# Patient Record
Sex: Female | Born: 1960 | Race: White | Hispanic: No | Marital: Married | Smoking: Never smoker
Health system: Southern US, Community
[De-identification: ages and names within clinical notes are randomized; demographics above are authoritative.]

## PROBLEM LIST (undated history)

## (undated) DIAGNOSIS — T7840XA Allergy, unspecified, initial encounter: Secondary | ICD-10-CM

## (undated) DIAGNOSIS — S42309A Unspecified fracture of shaft of humerus, unspecified arm, initial encounter for closed fracture: Secondary | ICD-10-CM

## (undated) DIAGNOSIS — G4730 Sleep apnea, unspecified: Secondary | ICD-10-CM

## (undated) DIAGNOSIS — I10 Essential (primary) hypertension: Secondary | ICD-10-CM

## (undated) DIAGNOSIS — R748 Abnormal levels of other serum enzymes: Secondary | ICD-10-CM

## (undated) DIAGNOSIS — L0292 Furuncle, unspecified: Secondary | ICD-10-CM

## (undated) DIAGNOSIS — K219 Gastro-esophageal reflux disease without esophagitis: Secondary | ICD-10-CM

## (undated) HISTORY — PX: PAROTIDECTOMY: SUR1003

## (undated) HISTORY — DX: Allergy, unspecified, initial encounter: T78.40XA

## (undated) HISTORY — PX: TOTAL ABDOMINAL HYSTERECTOMY: SHX209

## (undated) HISTORY — DX: Unspecified fracture of shaft of humerus, unspecified arm, initial encounter for closed fracture: S42.309A

## (undated) HISTORY — PX: ABDOMINAL HYSTERECTOMY: SHX81

## (undated) HISTORY — PX: APPENDECTOMY: SHX54

## (undated) HISTORY — DX: Gastro-esophageal reflux disease without esophagitis: K21.9

## (undated) HISTORY — DX: Essential (primary) hypertension: I10

## (undated) HISTORY — DX: Furuncle, unspecified: L02.92

## (undated) HISTORY — PX: OVARIAN CYST REMOVAL: SHX89

## (undated) HISTORY — DX: Abnormal levels of other serum enzymes: R74.8

## (undated) HISTORY — DX: Sleep apnea, unspecified: G47.30

---

## 2011-04-17 ENCOUNTER — Ambulatory Visit: Payer: Self-pay

## 2012-01-26 ENCOUNTER — Ambulatory Visit: Payer: Self-pay | Admitting: Otolaryngology

## 2012-03-15 ENCOUNTER — Ambulatory Visit: Payer: Self-pay | Admitting: Otolaryngology

## 2012-07-19 ENCOUNTER — Ambulatory Visit: Payer: Self-pay | Admitting: Otolaryngology

## 2012-08-18 ENCOUNTER — Ambulatory Visit: Payer: Self-pay | Admitting: Otolaryngology

## 2012-08-19 LAB — PATHOLOGY REPORT

## 2013-04-25 ENCOUNTER — Ambulatory Visit: Payer: Self-pay | Admitting: Gastroenterology

## 2014-06-23 NOTE — Op Note (Signed)
PATIENT NAME:  Carrie Arroyo, Carrie Arroyo MR#:  [id number] DATE OF BIRTH:  [birth year]  DATE OF PROCEDURE:  08/18/2012  PREOPERATIVE DIAGNOSIS: Bilateral parotid masses.   POSTOPERATIVE DIAGNOSIS: Bilateral parotid masses.   PROCEDURE:  1. Right superficial parotidectomy with facial nerve dissection.  2. Ultrasound-guided fine needle aspiration of left parotid mass.   SURGEON: Ollen Grossaul S. Willeen CassBennett, MD  FIRST ASSISTANT: Davina Pokehapman T. McQueen, MD   ANESTHESIA: General endotracheal.   INDICATIONS: The patient with a history of right parotid mass, with a smaller mass notable in the left parotid on CT imaging. 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   FINDINGS: There was an approximately 2 cm right tail of parotid mass removed. Needle aspiration was performed of the smaller left parotid mass, which was difficult to palpate but visualized easily on ultrasound.   COMPLICATIONS: None.   DESCRIPTION OF PROCEDURE: After obtaining informed consent, the patient was taken to the operating room and placed in the supine position. After induction of general endotracheal anesthesia, the patient was turned 90 degrees. The facial nerve monitors were placed in the usual fashion on the right face and the skin injected with 1% lidocaine with epinephrine 1:200,000. She was then prepped and draped in the usual sterile fashion. A 15-blade was used to incise the skin in an S-shaped incision going from in front of the ear down below the earlobe and curving down into the neck to utilize an upper neck skin crease. The incision was carried down through the platysma, and a subplatysmal skin flap elevated over the parotid fascia, taking care not to enter the palpable mass in the tail of the parotid. Dissection proceeded inferiorly down to the sternocleidomastoid muscle and proceeded superiorly up to expose the tragal cartilage. Dissection then proceeded down to the digastric muscle inferiorly, dividing the fascial attachments  between the parotid and the sternocleidomastoid and mastoid bone area. Care was taken to monitor the face, and proper functioning of the facial nerve monitor was confirmed for this purpose. Dissection proceeded down towards the stylomastoid foramen, carefully watching for any movement, and the facial nerve was identified and confirmed with the stimulator. The trunk of the facial nerve was then traced out to identify the inferior, superior and middle branches, which were then carefully dissected out, using either the bipolar or the Harmonic scalpel to divide parotid tissue as the dissection progressed. Smaller branches were confirmed with the nerve stimulator on a low setting. The mass and associated parotid tissue were dissected away from the nerve in a sequential fashion and away from the sternocleidomastoid muscle and the retrofacial vein until the mass and associated parotid tissue were free of the facial nerve completely. The mass and associated parotid tissue were then resected anterior to the mass, with the facial nerve bundles all down and in clear view. The mass was sent with the anterior border marked with a stitch. The wound was irrigated, and the facial nerve main trunk stimulated with all branches moving. A #10 TLS drain was placed through the skin and secured with a 5-0 Prolene suture. The residual parotid tissue was tacked back to the sternocleidomastoid muscle with 4-0 Vicryl to help fill the defect from the tumor excision. The skin was closed with 4-0 Vicryl suture for the subcutaneous closure, followed by 5-0 Prolene suture in a running locked stitch for the skin. Bacitracin ointment was then applied.   Her head was then turned in the opposite direction and the parotid palpated on the left side.  I could vaguely feel a small mass in the tail of the parotid, and this was confirmed, after prepping the skin sterilely, by using the ultrasound. A 22-gauge needle was used to make 2 different passes  through the mass under ultrasound guidance, and the material sent in saline to pathology. The patient was then returned to the anesthesiologist for awakening. She was awakened and taken to the recovery room in good condition postoperatively. Blood loss was approximately 50 mL.  ____________________________ Ollen Gross. Willeen Cass, MD psb:OSi D: 08/18/2012 10:00:10 ET T: 08/18/2012 12:07:59 ET JOB#: [id number]  cc: Ollen Gross. Willeen Cass, MD, <Dictator> Sandi Mealy MD ELECTRONICALLY SIGNED 08/20/2012 21:51

## 2014-08-14 ENCOUNTER — Telehealth: Payer: Self-pay

## 2014-08-14 MED ORDER — NITROFURANTOIN MONOHYD MACRO 100 MG PO CAPS: 100.0000 mg | ORAL_CAPSULE | Freq: Two times a day (BID) | ORAL | Status: DC

## 2014-08-14 NOTE — Telephone Encounter (Signed)
-----   Message from Gabriel Cirri, NP sent at 08/14/2014  4:31 PM EDT ----- Regarding: FW: Possible UTI   ----- Message -----    From: Bayard Hugger, CMA    Sent: 08/14/2014   4:02 PM      To: Gabriel Cirri, NP Subject: Possible UTI                                   Pt called and stated she went to the bathroom and had burning, pain, pressure, etc. Thinks she has a UTI and wants to know if you can call her in a medication for this because she is going out of town tomorrow. She stated she could make an appt to come in next week if need be.

## 2014-08-14 NOTE — Telephone Encounter (Signed)
Pt called stating when she used the restroom, she had burning, pain, pressure, etc, so she believes she may have a UTI. She wants to know if you can write her a rx for this because she is going out of town tomorrow. She also stated she can come in next week for an appt if need be.

## 2014-08-21 DIAGNOSIS — R7401 Elevation of levels of liver transaminase levels: Secondary | ICD-10-CM

## 2014-08-21 DIAGNOSIS — E8881 Metabolic syndrome: Secondary | ICD-10-CM

## 2014-08-21 DIAGNOSIS — R7301 Impaired fasting glucose: Secondary | ICD-10-CM

## 2014-08-21 DIAGNOSIS — R7303 Prediabetes: Secondary | ICD-10-CM | POA: Insufficient documentation

## 2014-08-21 DIAGNOSIS — E785 Hyperlipidemia, unspecified: Secondary | ICD-10-CM | POA: Insufficient documentation

## 2014-08-21 DIAGNOSIS — E559 Vitamin D deficiency, unspecified: Secondary | ICD-10-CM

## 2014-08-21 DIAGNOSIS — J309 Allergic rhinitis, unspecified: Secondary | ICD-10-CM | POA: Insufficient documentation

## 2014-08-21 DIAGNOSIS — R7402 Elevation of levels of lactic acid dehydrogenase (LDH): Secondary | ICD-10-CM

## 2014-08-21 DIAGNOSIS — E669 Obesity, unspecified: Secondary | ICD-10-CM

## 2014-08-21 DIAGNOSIS — R740 Nonspecific elevation of levels of transaminase and lactic acid dehydrogenase [LDH]: Secondary | ICD-10-CM

## 2014-08-21 DIAGNOSIS — I10 Essential (primary) hypertension: Secondary | ICD-10-CM | POA: Insufficient documentation

## 2014-08-22 ENCOUNTER — Encounter: Payer: Self-pay | Admitting: Unknown Physician Specialty

## 2014-08-22 ENCOUNTER — Ambulatory Visit (INDEPENDENT_AMBULATORY_CARE_PROVIDER_SITE_OTHER): Payer: BLUE CROSS/BLUE SHIELD | Admitting: Unknown Physician Specialty

## 2014-08-22 VITALS — BP 140/87 | HR 91 | Temp 98.4°F | Ht 64.6 in | Wt 217.2 lb

## 2014-08-22 DIAGNOSIS — R300 Dysuria: Secondary | ICD-10-CM | POA: Diagnosis not present

## 2014-08-22 MED ORDER — CIPROFLOXACIN HCL 250 MG PO TABS: 250.0000 mg | ORAL_TABLET | Freq: Two times a day (BID) | ORAL | Status: DC

## 2014-08-22 NOTE — Patient Instructions (Signed)

## 2014-08-22 NOTE — Progress Notes (Addendum)
   BP 140/87 mmHg  Pulse 91  Temp(Src) 98.4 F (36.9 C)  Ht 5' 4.6" (1.641 m)  Wt 217 lb 3.2 oz (98.521 kg)  BMI 36.59 kg/m2  SpO2 99%  LMP  (LMP Unknown)   Subjective:    Patient ID: Carrie Arroyo, female    DOB: 12/30/1960, 54 y.o.   MRN: [id number]  HPI: Carrie Arroyo is a 54 y.o. female  Chief Complaint  Patient presents with  . Urinary Tract Infection    Relevant past medical, surgical, family and social history reviewed and updated as indicated. Interim medical history since our last visit reviewed. Allergies and medications reviewed and updated.  Urinary Tract Infection  This is a new (Recieved Macrobid which helped but it is still there) problem. The current episode started in the past 7 days. The problem occurs every urination. The quality of the pain is described as burning. There has been no fever. Associated symptoms include urgency. Associated symptoms comments: pressure. She has tried antibiotics for the symptoms. The treatment provided moderate relief.     Review of Systems  Genitourinary: Positive for urgency.    Per HPI unless specifically indicated above     Objective:    BP 140/87 mmHg  Pulse 91  Temp(Src) 98.4 F (36.9 C)  Ht 5' 4.6" (1.641 m)  Wt 217 lb 3.2 oz (98.521 kg)  BMI 36.59 kg/m2  SpO2 99%  LMP  (LMP Unknown)  Wt Readings from Last 3 Encounters:  08/22/14 217 lb 3.2 oz (98.521 kg)  07/21/14 215 lb (97.523 kg)    Physical Exam  Constitutional: She is oriented to person, place, and time. She appears well-developed and well-nourished. No distress.  HENT:  Head: Normocephalic and atraumatic.  Eyes: Conjunctivae and lids are normal. Right eye exhibits no discharge. Left eye exhibits no discharge. No scleral icterus.  Cardiovascular: Normal rate, regular rhythm and normal heart sounds.   Pulmonary/Chest: Effort normal. No respiratory distress.  Abdominal: Soft. Normal appearance and bowel sounds are normal. She exhibits no distension.  There is no splenomegaly or hepatomegaly. There is no tenderness.  Musculoskeletal: Normal range of motion.  Neurological: She is alert and oriented to person, place, and time.  Skin: Skin is intact. No rash noted. No pallor.  Psychiatric: She has a normal mood and affect. Her behavior is normal. Judgment and thought content normal.      Assessment & Plan:   Problem List Items Addressed This Visit    None    Visit Diagnoses    Dysuria    -  Primary    Relevant Orders    UA/M w/rflx Culture, Routine       Urine negative except for small amount of blood.  Will rx Cipro as symptoms partially relieved by Macrobid.    Follow up plan: Return if symptoms worsen or fail to improve.

## 2014-08-22 NOTE — Addendum Note (Signed)
Addended by: Gabriel Cirri on: 08/22/2014 04:39 PM   Modules accepted: Kipp Brood

## 2014-08-23 LAB — UA/M W/RFLX CULTURE, ROUTINE
Bilirubin, UA: NEGATIVE
Glucose, UA: NEGATIVE
Ketones, UA: NEGATIVE
Leukocytes, UA: NEGATIVE
Nitrite, UA: NEGATIVE
Protein, UA: NEGATIVE
Specific Gravity, UA: 1.005 (ref 1.005–1.030)
Urobilinogen, Ur: 0.2 mg/dL (ref 0.2–1.0)
pH, UA: 5.5 (ref 5.0–7.5)

## 2014-08-23 LAB — MICROSCOPIC EXAMINATION: WBC, UA: NONE SEEN /hpf (ref 0–?)

## 2015-01-08 ENCOUNTER — Other Ambulatory Visit: Payer: Self-pay

## 2015-01-08 MED ORDER — BENAZEPRIL HCL 20 MG PO TABS: 20.0000 mg | ORAL_TABLET | Freq: Every day | ORAL | Status: DC

## 2015-01-08 NOTE — Telephone Encounter (Signed)
Patient has appointment 01/15/15 and pharmacy is [facility] on [address].

## 2015-01-15 ENCOUNTER — Encounter: Payer: Self-pay | Admitting: Unknown Physician Specialty

## 2015-01-15 ENCOUNTER — Ambulatory Visit (INDEPENDENT_AMBULATORY_CARE_PROVIDER_SITE_OTHER): Payer: BLUE CROSS/BLUE SHIELD | Admitting: Unknown Physician Specialty

## 2015-01-15 VITALS — BP 129/78 | HR 89 | Temp 98.9°F | Ht 65.2 in | Wt 209.6 lb

## 2015-01-15 DIAGNOSIS — E785 Hyperlipidemia, unspecified: Secondary | ICD-10-CM | POA: Diagnosis not present

## 2015-01-15 DIAGNOSIS — J0100 Acute maxillary sinusitis, unspecified: Secondary | ICD-10-CM | POA: Diagnosis not present

## 2015-01-15 DIAGNOSIS — I10 Essential (primary) hypertension: Secondary | ICD-10-CM

## 2015-01-15 DIAGNOSIS — R7301 Impaired fasting glucose: Secondary | ICD-10-CM

## 2015-01-15 DIAGNOSIS — E8881 Metabolic syndrome: Secondary | ICD-10-CM | POA: Diagnosis not present

## 2015-01-15 LAB — LIPID PANEL PICCOLO, WAIVED
Chol/HDL Ratio Piccolo,Waive: 3.9 mg/dL
Cholesterol Piccolo, Waived: 179 mg/dL (ref <200)
HDL Chol Piccolo, Waived: 46 mg/dL — ABNORMAL LOW (ref >59)
LDL Chol Calc Piccolo Waived: 103 mg/dL — ABNORMAL HIGH (ref <100)
Triglycerides Piccolo,Waived: 147 mg/dL (ref <150)
VLDL Chol Calc Piccolo,Waive: 29 mg/dL (ref <30)

## 2015-01-15 LAB — BAYER DCA HB A1C WAIVED: HB A1C (BAYER DCA - WAIVED): 6.1 % (ref <7.0)

## 2015-01-15 LAB — MICROALBUMIN, URINE WAIVED
Creatinine, Urine Waived: 10 mg/dL (ref 10–300)
Microalb, Ur Waived: 10 mg/L (ref 0–19)
Microalb/Creat Ratio: <30 mg/g (ref <30)

## 2015-01-15 MED ORDER — AZITHROMYCIN 250 MG PO TABS: ORAL_TABLET | ORAL | Status: DC

## 2015-01-15 MED ORDER — SIMVASTATIN 20 MG PO TABS: 20.0000 mg | ORAL_TABLET | Freq: Every evening | ORAL | Status: DC | PRN

## 2015-01-15 NOTE — Assessment & Plan Note (Signed)
Reviewed lipid panel.  LDL was 103.  Continue present medications.

## 2015-01-15 NOTE — Progress Notes (Signed)
+----  BP 129/78 mmHg  Pulse 89  Temp(Src) 98.9 F (37.2 C)  Ht 5' 5.2" (1.656 m)  Wt 209 lb 9.6 oz (95.074 kg)  BMI 34.67 kg/m2  SpO2 100%  LMP  (LMP Unknown)   Subjective:    Patient ID: Carrie Arroyo, female    DOB: 1960-08-18, 54 y.o.   MRN: [id number]  HPI: Carrie Arroyo is a 54 y.o. female  Chief Complaint  Patient presents with  . Hyperlipidemia  . Hypertension   Hypertension Using medications without difficulty Average home BPs 120's/low 70's   No problems or lightheadedness No chest pain with exertion or shortness of breath No Edema   Hyperlipidemia Using medications without problems: No Muscle aches:  Diet compliance: Exercise:  IFG Hgb A1C 6.2 last visit  Hypertension  URI  This is a new problem. The current episode started 1 to 4 weeks ago. The problem has been unchanged (Not quite gone yet). There has been no fever. Associated symptoms include congestion, coughing and ear pain. She has tried decongestant for the symptoms. The treatment provided no relief.    Relevant past medical, surgical, family and social history reviewed and updated as indicated. Interim medical history since our last visit reviewed. Allergies and medications reviewed and updated.  Review of Systems  HENT: Positive for congestion and ear pain.   Respiratory: Positive for cough.     Per HPI unless specifically indicated above     Objective:    BP 129/78 mmHg  Pulse 89  Temp(Src) 98.9 F (37.2 C)  Ht 5' 5.2" (1.656 m)  Wt 209 lb 9.6 oz (95.074 kg)  BMI 34.67 kg/m2  SpO2 100%  LMP  (LMP Unknown)  Wt Readings from Last 3 Encounters:  01/15/15 209 lb 9.6 oz (95.074 kg)  08/22/14 217 lb 3.2 oz (98.521 kg)  07/21/14 215 lb (97.523 kg)    Physical Exam  Constitutional: She is oriented to person, place, and time. She appears well-developed and well-nourished. No distress.  HENT:  Head: Normocephalic and atraumatic.  Right Ear: Tympanic membrane and ear canal  normal.  Left Ear: Tympanic membrane and ear canal normal.  Nose: No rhinorrhea. Right sinus exhibits maxillary sinus tenderness. Right sinus exhibits no frontal sinus tenderness. Left sinus exhibits maxillary sinus tenderness. Left sinus exhibits no frontal sinus tenderness.  Eyes: Conjunctivae and lids are normal. Right eye exhibits no discharge. Left eye exhibits no discharge. No scleral icterus.  Cardiovascular: Normal rate, regular rhythm and normal heart sounds.   Pulmonary/Chest: Effort normal. No respiratory distress. She has wheezes.  Abdominal: Normal appearance. There is no splenomegaly or hepatomegaly.  Musculoskeletal: Normal range of motion.  Neurological: She is alert and oriented to person, place, and time.  Skin: Skin is intact. No rash noted. No pallor.  Psychiatric: She has a normal mood and affect. Her behavior is normal. Judgment and thought content normal.       Assessment & Plan:   Problem List Items Addressed This Visit      Unprioritized   Metabolic syndrome - Primary    Hgb A1C is 6.1      Relevant Orders   Lipid Panel Piccolo, Waived   Bayer DCA Hb A1c Waived   Microalbumin, Urine Waived   Uric acid   Comprehensive metabolic panel   Hypertension    Stable, continue present medications.       Relevant Medications   simvastatin (ZOCOR) 20 MG tablet   Other Relevant Orders   Lipid  Panel Piccolo, Waived   Bayer DCA Hb A1c Waived   Microalbumin, Urine Waived   Uric acid   Comprehensive metabolic panel   Hyperlipidemia    Reviewed lipid panel.  LDL was 103.  Continue present medications.         Relevant Medications   simvastatin (ZOCOR) 20 MG tablet   Other Relevant Orders   Lipid Panel Piccolo, Waived   Bayer DCA Hb A1c Waived   Microalbumin, Urine Waived   Uric acid   Comprehensive metabolic panel    Other Visit Diagnoses    IFG (impaired fasting glucose)        Relevant Orders    Lipid Panel Piccolo, Waived    Bayer DCA Hb A1c Waived     Microalbumin, Urine Waived    Uric acid    Comprehensive metabolic panel    Acute maxillary sinusitis, recurrence not specified        Relevant Medications    azithromycin (ZITHROMAX) 250 MG tablet        Follow up plan: Return in about 6 months (around 07/15/2015) for physical.

## 2015-01-15 NOTE — Assessment & Plan Note (Signed)
Hgb A1C is 6.1% 

## 2015-01-15 NOTE — Assessment & Plan Note (Signed)
Stable, continue present medications.   

## 2015-01-16 LAB — COMPREHENSIVE METABOLIC PANEL
ALT: 53 IU/L — ABNORMAL HIGH (ref 0–32)
AST: 49 IU/L — ABNORMAL HIGH (ref 0–40)
Albumin/Globulin Ratio: 2.2 (ref 1.1–2.5)
Albumin: 4.6 g/dL (ref 3.5–5.5)
Alkaline Phosphatase: 100 IU/L (ref 39–117)
BUN/Creatinine Ratio: 11 (ref 9–23)
BUN: 7 mg/dL (ref 6–24)
Bilirubin Total: 0.3 mg/dL (ref 0.0–1.2)
CO2: 25 mmol/L (ref 18–29)
Calcium: 10.2 mg/dL (ref 8.7–10.2)
Chloride: 100 mmol/L (ref 97–106)
Creatinine, Ser: 0.66 mg/dL (ref 0.57–1.00)
GFR calc Af Amer: 117 mL/min/{1.73_m2} (ref >59)
GFR calc non Af Amer: 101 mL/min/{1.73_m2} (ref >59)
Globulin, Total: 2.1 g/dL (ref 1.5–4.5)
Glucose: 100 mg/dL — ABNORMAL HIGH (ref 65–99)
Potassium: 4.9 mmol/L (ref 3.5–5.2)
Sodium: 141 mmol/L (ref 136–144)
Total Protein: 6.7 g/dL (ref 6.0–8.5)

## 2015-01-16 LAB — URIC ACID: Uric Acid: 5.1 mg/dL (ref 2.5–7.1)

## 2015-01-17 ENCOUNTER — Encounter: Payer: Self-pay | Admitting: Unknown Physician Specialty

## 2015-01-27 ENCOUNTER — Other Ambulatory Visit: Payer: Self-pay | Admitting: Unknown Physician Specialty

## 2015-01-29 NOTE — Telephone Encounter (Signed)
Pt needs check further refills 

## 2015-02-20 ENCOUNTER — Other Ambulatory Visit: Payer: Self-pay | Admitting: Unknown Physician Specialty

## 2015-03-25 ENCOUNTER — Other Ambulatory Visit: Payer: Self-pay | Admitting: Unknown Physician Specialty

## 2015-04-28 ENCOUNTER — Encounter: Payer: Self-pay | Admitting: Emergency Medicine

## 2015-04-28 ENCOUNTER — Emergency Department
Admission: EM | Admit: 2015-04-28 | Discharge: 2015-04-28 | Disposition: A | Discharge status: Home / Self Care | Payer: BLUE CROSS/BLUE SHIELD | Attending: Emergency Medicine | Admitting: Emergency Medicine

## 2015-04-28 ENCOUNTER — Emergency Department: Payer: BLUE CROSS/BLUE SHIELD

## 2015-04-28 DIAGNOSIS — I10 Essential (primary) hypertension: Secondary | ICD-10-CM | POA: Insufficient documentation

## 2015-04-28 DIAGNOSIS — R599 Enlarged lymph nodes, unspecified: Secondary | ICD-10-CM

## 2015-04-28 DIAGNOSIS — Z7951 Long term (current) use of inhaled steroids: Secondary | ICD-10-CM | POA: Diagnosis not present

## 2015-04-28 DIAGNOSIS — Z79899 Other long term (current) drug therapy: Secondary | ICD-10-CM | POA: Insufficient documentation

## 2015-04-28 DIAGNOSIS — Z7982 Long term (current) use of aspirin: Secondary | ICD-10-CM | POA: Insufficient documentation

## 2015-04-28 DIAGNOSIS — Z880 Allergy status to penicillin: Secondary | ICD-10-CM | POA: Insufficient documentation

## 2015-04-28 DIAGNOSIS — R591 Generalized enlarged lymph nodes: Secondary | ICD-10-CM

## 2015-04-28 DIAGNOSIS — H9202 Otalgia, left ear: Secondary | ICD-10-CM | POA: Diagnosis not present

## 2015-04-28 DIAGNOSIS — R590 Localized enlarged lymph nodes: Secondary | ICD-10-CM | POA: Insufficient documentation

## 2015-04-28 LAB — CBC WITH DIFFERENTIAL/PLATELET
Basophils Absolute: 0.1 10*3/uL (ref 0–0.1)
Basophils Relative: 0 %
Eosinophils Absolute: 0.2 10*3/uL (ref 0–0.7)
Eosinophils Relative: 1 %
HCT: 42.2 % (ref 35.0–47.0)
Hemoglobin: 14.4 g/dL (ref 12.0–16.0)
Lymphocytes Relative: 28 %
Lymphs Abs: 6.8 10*3/uL — ABNORMAL HIGH (ref 1.0–3.6)
MCH: 31.5 pg (ref 26.0–34.0)
MCHC: 34 g/dL (ref 32.0–36.0)
MCV: 92.7 fL (ref 80.0–100.0)
Monocytes Absolute: 3 10*3/uL — ABNORMAL HIGH (ref 0.2–0.9)
Monocytes Relative: 12 %
Neutro Abs: 14.1 10*3/uL — ABNORMAL HIGH (ref 1.4–6.5)
Neutrophils Relative %: 59 %
Platelets: 328 10*3/uL (ref 150–440)
RBC: 4.55 MIL/uL (ref 3.80–5.20)
RDW: 12.6 % (ref 11.5–14.5)
WBC: 24.2 10*3/uL — ABNORMAL HIGH (ref 3.6–11.0)

## 2015-04-28 LAB — BASIC METABOLIC PANEL
Anion gap: 10 (ref 5–15)
BUN: 13 mg/dL (ref 6–20)
CO2: 24 mmol/L (ref 22–32)
Calcium: 8.7 mg/dL — ABNORMAL LOW (ref 8.9–10.3)
Chloride: 100 mmol/L — ABNORMAL LOW (ref 101–111)
Creatinine, Ser: 0.65 mg/dL (ref 0.44–1.00)
GFR calc Af Amer: >60 mL/min (ref >60)
GFR calc non Af Amer: >60 mL/min (ref >60)
Glucose, Bld: 94 mg/dL (ref 65–99)
Potassium: 3.3 mmol/L — ABNORMAL LOW (ref 3.5–5.1)
Sodium: 134 mmol/L — ABNORMAL LOW (ref 135–145)

## 2015-04-28 MED ORDER — DEXAMETHASONE SODIUM PHOSPHATE 10 MG/ML IJ SOLN
10.0000 mg | Freq: Once | INTRAMUSCULAR | Status: AC
  Administered 2015-04-28: 10 mg via INTRAMUSCULAR
  Filled 2015-04-28: qty 1

## 2015-04-28 MED ORDER — IOHEXOL 300 MG/ML  SOLN
75.0000 mL | Freq: Once | INTRAMUSCULAR | Status: AC | PRN
  Administered 2015-04-28: 75 mL via INTRAVENOUS
  Filled 2015-04-28: qty 75

## 2015-04-28 NOTE — ED Notes (Signed)
Reviewed d/c instructions, follow-up care, and medications with pt.  Pt verbalized understanding. 

## 2015-04-28 NOTE — ED Notes (Signed)
Reddened outer L ear x 4 days. Lymph node enlargement.

## 2015-04-28 NOTE — Discharge Instructions (Signed)
Advised discontinuing antibiotics. Continue prednisone as directed. Follow-up with the ENT clinic by calling for an appointment on Monday morning.

## 2015-04-28 NOTE — ED Provider Notes (Signed)
[facility] Emergency Department Provider Note ____________________________________________  Time seen: Approximately 5:56 PM  I have reviewed the triage vital signs and the nursing notes.   HISTORY  Chief Complaint Ear Pain    HPI Carrie Arroyo is a 55 y.o. female patient complain of lymph node enlargement inferior anterior left ear for 4 days. Patient was seen by PCP and given clindamycin and prednisone. Patient is much improved with her first 2 days and that her condition worsen in the last 2 days. Patient states her pain is a 9/10. No palliative measures taken for pain. Patient denies any hearing loss or vertigo. Patient denies any fever or upper rest or infection signs and symptoms.   Past Medical History  Diagnosis Date  . Allergy   . Hypertension   . Boils   . Sleep apnea   . Elevated liver enzymes     Patient Active Problem List   Diagnosis Date Noted  . Metabolic syndrome 08/21/2014  . Hypertension 08/21/2014  . Vitamin D deficiency 08/21/2014  . Allergic rhinitis 08/21/2014  . Nonspecific elevation of levels of transaminase or lactic acid dehydrogenase (LDH) 08/21/2014  . Impaired fasting glucose 08/21/2014  . Hyperlipidemia 08/21/2014  . Obesity 08/21/2014    Past Surgical History  Procedure Laterality Date  . Abdominal hysterectomy    . Ovarian cyst removal    . Appendectomy    . Parotidectomy      Current Outpatient Rx  Name  Route  Sig  Dispense  Refill  . aspirin 81 MG tablet   Oral   Take 81 mg by mouth daily.         Marland Kitchen azithromycin (ZITHROMAX) 250 MG tablet      As directed   6 tablet   0   . benazepril (LOTENSIN) 20 MG tablet      TAKE 1 TABLET (20 MG TOTAL) BY MOUTH DAILY.   30 tablet   5   . benazepril (LOTENSIN) 20 MG tablet      TAKE 1 TABLET (20 MG TOTAL) BY MOUTH DAILY.   30 tablet   1   . fluticasone (FLONASE) 50 MCG/ACT nasal spray   Each Nare   Place 2 sprays into both nostrils daily.          Marland Kitchen loratadine (CLARITIN) 10 MG tablet   Oral   Take 10 mg by mouth daily.         . minocycline (MINOCIN,DYNACIN) 100 MG capsule      TAKE ONE CAPSULE BY MOUTH TWICE A DAY FOR FLARES      4   . Multiple Vitamin (MULTIVITAMIN) tablet   Oral   Take 1 tablet by mouth daily.         Marland Kitchen omeprazole (PRILOSEC) 20 MG capsule   Oral   Take 20 mg by mouth daily.         . simvastatin (ZOCOR) 20 MG tablet   Oral   Take 1 tablet (20 mg total) by mouth at bedtime as needed.   30 tablet   0     Allergies Penicillin g benzathine and Sulfa antibiotics  Family History  Problem Relation Age of Onset  . Adopted: Yes  . Family history unknown: Yes    Social History Social History  Substance Use Topics  . Smoking status: Never Smoker   . Smokeless tobacco: Never Used  . Alcohol Use: 1.2 oz/week    2 Glasses of wine per week  Review of Systems Constitutional: No fever/chills Eyes: No visual changes. ENT: No sore throat edema and erythema inferior anterior left ear. Cardiovascular: Denies chest pain. Respiratory: Denies shortness of breath. Gastrointestinal: No abdominal pain.  No nausea, no vomiting.  No diarrhea.  No constipation. Genitourinary: Negative for dysuria. Musculoskeletal: Negative for back pain. Skin: Negative for rash. Neurological: Negative for headaches, focal weakness or numbness. Endocrine:Hypertension Hematological/Lymphatic: Allergic/Immunilogical: Penicillin and sulfa antibiotics  10-point ROS otherwise negative.  ____________________________________________   PHYSICAL EXAM:  VITAL SIGNS: ED Triage Vitals  Enc Vitals Group     BP 04/28/15 1743 176/90 mmHg     Pulse Rate 04/28/15 1743 83     Resp 04/28/15 1743 18     Temp 04/28/15 1743 98 F (36.7 C)     Temp Source 04/28/15 1743 Oral     SpO2 04/28/15 1743 100 %     Weight 04/28/15 1743 210 lb (95.255 kg)     Height 04/28/15 1743  (1.651 m)     Head Cir --      Peak Flow  --      Pain Score 04/28/15 1744 9     Pain Loc --      Pain Edu? --      Excl. in GC? --     Constitutional: Alert and oriented. Well appearing and in no acute distress. Eyes: Conjunctivae are normal. PERRL. EOMI. Head: Atraumatic. Nose: No congestion/rhinnorhea. Mouth/Throat: Mucous membranes are moist.  Oropharynx non-erythematous. Neck: No stridor.  No cervical spine tenderness to palpation. Hematological/Lymphatic/Immunilogical: Left auricular  lymphadenopathy. Cardiovascular: Normal rate, regular rhythm. Grossly normal heart sounds.  Good peripheral circulation. Elevated blood pressure Respiratory: Normal respiratory effort.  No retractions. Lungs CTAB. Gastrointestinal: Soft and nontender. No distention. No abdominal bruits. No CVA tenderness. Musculoskeletal: No lower extremity tenderness nor edema.  No joint effusions. Neurologic:  Normal speech and language. No gross focal neurologic deficits are appreciated. No gait instability. Skin:  Skin is warm, dry and intact. No rash noted. Psychiatric: Mood and affect are normal. Speech and behavior are normal.  ____________________________________________   LABS (all labs ordered are listed, but only abnormal results are displayed)  Labs Reviewed  BASIC METABOLIC PANEL - Abnormal; Notable for the following:    Sodium 134 (*)    Potassium 3.3 (*)    Chloride 100 (*)    Calcium 8.7 (*)    All other components within normal limits  CBC WITH DIFFERENTIAL/PLATELET - Abnormal; Notable for the following:    WBC 24.2 (*)    Neutro Abs 14.1 (*)    Lymphs Abs 6.8 (*)    Monocytes Absolute 3.0 (*)    All other components within normal limits   ____________________________________________  EKG   ____________________________________________  RADIOLOGY  CT showed a left parotid mass measuring 1.41.8 cm consistent for cystic mass . ____________________________________________   PROCEDURES  Procedure(s) performed:  None  Critical Care performed: No  ____________________________________________   INITIAL IMPRESSION / ASSESSMENT AND PLAN / ED COURSE  Pertinent labs & imaging results that were available during my care of the patient were reviewed by me and considered in my medical decision making (see chart for details).  Left parotid mass. Patient advised to discontinue antibiotics at this time. Fast patient to continue taking prednisone and follow-up with the ENT clinic by calling for an appointment on Monday morning. ____________________________________________   FINAL CLINICAL IMPRESSION(S) / ED DIAGNOSES  Final diagnoses:  Adenopathy      Joni Reining,  PA-C 04/28/15 2057  Jennye Moccasin, MD 04/28/15 2123

## 2015-04-28 NOTE — ED Notes (Signed)
Pt came to nurse's station upset.  Pt given update on plan of care, PA called to room to speak w/ pt regarding plan of care. Pt taken to CT.  While pt gone, pillow, blanket, remote, and ice water left in pt room per pt request

## 2015-06-04 DIAGNOSIS — D110 Benign neoplasm of parotid gland: Secondary | ICD-10-CM | POA: Diagnosis not present

## 2015-06-04 DIAGNOSIS — J301 Allergic rhinitis due to pollen: Secondary | ICD-10-CM | POA: Diagnosis not present

## 2015-07-23 ENCOUNTER — Encounter: Payer: Self-pay | Admitting: Unknown Physician Specialty

## 2015-07-23 ENCOUNTER — Ambulatory Visit (INDEPENDENT_AMBULATORY_CARE_PROVIDER_SITE_OTHER): Payer: BLUE CROSS/BLUE SHIELD | Admitting: Unknown Physician Specialty

## 2015-07-23 VITALS — BP 137/78 | HR 95 | Temp 98.1°F | Ht 64.8 in | Wt 212.6 lb

## 2015-07-23 DIAGNOSIS — R7301 Impaired fasting glucose: Secondary | ICD-10-CM | POA: Diagnosis not present

## 2015-07-23 DIAGNOSIS — E559 Vitamin D deficiency, unspecified: Secondary | ICD-10-CM | POA: Diagnosis not present

## 2015-07-23 DIAGNOSIS — E785 Hyperlipidemia, unspecified: Secondary | ICD-10-CM | POA: Diagnosis not present

## 2015-07-23 DIAGNOSIS — I10 Essential (primary) hypertension: Secondary | ICD-10-CM | POA: Diagnosis not present

## 2015-07-23 DIAGNOSIS — D72829 Elevated white blood cell count, unspecified: Secondary | ICD-10-CM

## 2015-07-23 LAB — BAYER DCA HB A1C WAIVED: HB A1C (BAYER DCA - WAIVED): 5.9 % (ref <7.0)

## 2015-07-23 LAB — MICROALBUMIN, URINE WAIVED
Creatinine, Urine Waived: 50 mg/dL (ref 10–300)
Microalb, Ur Waived: 10 mg/L (ref 0–19)
Microalb/Creat Ratio: <30 mg/g (ref <30)

## 2015-07-23 NOTE — Assessment & Plan Note (Signed)
Stable, continue present medications.   

## 2015-07-23 NOTE — Assessment & Plan Note (Signed)
History of Will check levels. 

## 2015-07-23 NOTE — Progress Notes (Signed)
BP 137/78 mmHg  Pulse 95  Temp(Src) 98.1 F (36.7 C)  Ht 5' 4.8" (1.646 m)  Wt 212 lb 9.6 oz (96.435 kg)  BMI 35.59 kg/m2  SpO2 99%  LMP  (LMP Unknown)   Subjective:    Patient ID: Carrie Arroyo, female    DOB: 07/15/1960, 55 y.o.   MRN: [id number]  HPI: Carrie Arroyo is a 55 y.o. female  Chief Complaint  Patient presents with  . Annual Exam   Hypertension Using medications without difficulty Average home BPs 120's/78  No problems or lightheadedness No chest pain with exertion or shortness of breath No Edema   Hyperlipidemia Using medications without problems: No Muscle aches  Diet compliance: "OK" Exercise: "could be better"  IFG/Metabolic syndrome Last Hgb A1C was 6.1  Social History   Social History  . Marital Status: Married    Spouse Name: N/A  . Number of Children: N/A  . Years of Education: N/A   Occupational History  . Not on file.   Social History Main Topics  . Smoking status: Never Smoker   . Smokeless tobacco: Never Used  . Alcohol Use: 1.2 oz/week    2 Glasses of wine per week  . Drug Use: No  . Sexual Activity: No   Other Topics Concern  . Not on file   Social History Narrative   Family History  Problem Relation Age of Onset  . Adopted: Yes  . Family history unknown: Yes   Past Surgical History  Procedure Laterality Date  . Abdominal hysterectomy    . Ovarian cyst removal    . Appendectomy    . Parotidectomy     Past Medical History  Diagnosis Date  . Allergy   . Hypertension   . Boils   . Sleep apnea   . Elevated liver enzymes        Relevant past medical, surgical, family and social history reviewed and updated as indicated. Interim medical history since our last visit reviewed. Allergies and medications reviewed and updated.  Review of Systems  Per HPI unless specifically indicated above     Objective:    BP 137/78 mmHg  Pulse 95  Temp(Src) 98.1 F (36.7 C)  Ht 5' 4.8" (1.646 m)  Wt 212 lb 9.6 oz  (96.435 kg)  BMI 35.59 kg/m2  SpO2 99%  LMP  (LMP Unknown)  Wt Readings from Last 3 Encounters:  07/23/15 212 lb 9.6 oz (96.435 kg)  04/28/15 210 lb (95.255 kg)  01/15/15 209 lb 9.6 oz (95.074 kg)    Physical Exam  Constitutional: She is oriented to person, place, and time. She appears well-developed and well-nourished. No distress.  HENT:  Head: Normocephalic and atraumatic.  Eyes: Conjunctivae and lids are normal. Right eye exhibits no discharge. Left eye exhibits no discharge. No scleral icterus.  Neck: Normal range of motion. Neck supple. No JVD present. Carotid bruit is not present.  Cardiovascular: Normal rate, regular rhythm and normal heart sounds.   Pulmonary/Chest: Effort normal and breath sounds normal.  Abdominal: Normal appearance. There is no splenomegaly or hepatomegaly.  Musculoskeletal: Normal range of motion.  Neurological: She is alert and oriented to person, place, and time.  Skin: Skin is warm, dry and intact. No rash noted. No pallor.  Psychiatric: She has a normal mood and affect. Her behavior is normal. Judgment and thought content normal.    Results for orders placed or performed during the hospital encounter of 04/28/15  Basic metabolic panel  Result Value Ref Range   Sodium 134 (L) 135 - 145 mmol/L   Potassium 3.3 (L) 3.5 - 5.1 mmol/L   Chloride 100 (L) 101 - 111 mmol/L   CO2 24 22 - 32 mmol/L   Glucose, Bld 94 65 - 99 mg/dL   BUN 13 6 - 20 mg/dL   Creatinine, Ser 1.610.65 0.44 - 1.00 mg/dL   Calcium 8.7 (L) 8.9 - 10.3 mg/dL   GFR calc non Af Amer >60 >60 mL/min   GFR calc Af Amer >60 >60 mL/min   Anion gap 10 5 - 15  CBC with Differential  Result Value Ref Range   WBC 24.2 (H) 3.6 - 11.0 K/uL   RBC 4.55 3.80 - 5.20 MIL/uL   Hemoglobin 14.4 12.0 - 16.0 g/dL   HCT 09.642.2 04.535.0 - 40.947.0 %   MCV 92.7 80.0 - 100.0 fL   MCH 31.5 26.0 - 34.0 pg   MCHC 34.0 32.0 - 36.0 g/dL   RDW 81.112.6 91.411.5 - 78.214.5 %   Platelets 328 150 - 440 K/uL   Neutrophils Relative % 59%  %   Neutro Abs 14.1 (H) 1.4 - 6.5 K/uL   Lymphocytes Relative 28% %   Lymphs Abs 6.8 (H) 1.0 - 3.6 K/uL   Monocytes Relative 12% %   Monocytes Absolute 3.0 (H) 0.2 - 0.9 K/uL   Eosinophils Relative 1% %   Eosinophils Absolute 0.2 0 - 0.7 K/uL   Basophils Relative 0% %   Basophils Absolute 0.1 0 - 0.1 K/uL   Smear Review MORPHOLOGY UNREMARKABLE       Assessment & Plan:   Problem List Items Addressed This Visit      Unprioritized   Hyperlipidemia    Check lipid panel      Relevant Orders   Lipid Panel w/o Chol/HDL Ratio   Hypertension - Primary    Stable, continue present medications.        Relevant Orders   Comprehensive metabolic panel   TSH   Microalbumin, Urine Waived   Uric acid   Impaired fasting glucose    Check Hgb A1C      Relevant Orders   Bayer DCA Hb A1c Waived   Vitamin D deficiency    History of.  Will check levels      Relevant Orders   VITAMIN D 25 Hydroxy (Vit-D Deficiency, Fractures)    Other Visit Diagnoses    Elevated WBC count        Noted at the ER.  Will recheck    Relevant Orders    CBC with Differential/Platelet        Follow up plan: Return in about 6 months (around 01/23/2016).

## 2015-07-23 NOTE — Assessment & Plan Note (Signed)
Check Hgb A1C 

## 2015-07-23 NOTE — Assessment & Plan Note (Signed)
Check lipid panel  

## 2015-07-24 ENCOUNTER — Encounter: Payer: Self-pay | Admitting: Unknown Physician Specialty

## 2015-07-24 LAB — LIPID PANEL W/O CHOL/HDL RATIO
Cholesterol, Total: 184 mg/dL (ref 100–199)
HDL: 42 mg/dL (ref >39)
LDL Calculated: 113 mg/dL — ABNORMAL HIGH (ref 0–99)
Triglycerides: 145 mg/dL (ref 0–149)
VLDL Cholesterol Cal: 29 mg/dL (ref 5–40)

## 2015-07-24 LAB — COMPREHENSIVE METABOLIC PANEL
ALT: 43 IU/L — ABNORMAL HIGH (ref 0–32)
AST: 35 IU/L (ref 0–40)
Albumin/Globulin Ratio: 2.2 (ref 1.2–2.2)
Albumin: 4.7 g/dL (ref 3.5–5.5)
Alkaline Phosphatase: 95 IU/L (ref 39–117)
BUN/Creatinine Ratio: 17 (ref 9–23)
BUN: 12 mg/dL (ref 6–24)
Bilirubin Total: 0.4 mg/dL (ref 0.0–1.2)
CO2: 23 mmol/L (ref 18–29)
Calcium: 10.1 mg/dL (ref 8.7–10.2)
Chloride: 97 mmol/L (ref 96–106)
Creatinine, Ser: 0.71 mg/dL (ref 0.57–1.00)
GFR calc Af Amer: 112 mL/min/{1.73_m2} (ref >59)
GFR calc non Af Amer: 97 mL/min/{1.73_m2} (ref >59)
Globulin, Total: 2.1 g/dL (ref 1.5–4.5)
Glucose: 99 mg/dL (ref 65–99)
Potassium: 4.4 mmol/L (ref 3.5–5.2)
Sodium: 140 mmol/L (ref 134–144)
Total Protein: 6.8 g/dL (ref 6.0–8.5)

## 2015-07-24 LAB — CBC WITH DIFFERENTIAL/PLATELET
Basophils Absolute: 0 10*3/uL (ref 0.0–0.2)
Basos: 0 %
EOS (ABSOLUTE): 0.2 10*3/uL (ref 0.0–0.4)
Eos: 1 %
Hematocrit: 46 % (ref 34.0–46.6)
Hemoglobin: 15.8 g/dL (ref 11.1–15.9)
Immature Grans (Abs): 0 10*3/uL (ref 0.0–0.1)
Immature Granulocytes: 0 %
Lymphocytes Absolute: 3.3 10*3/uL — ABNORMAL HIGH (ref 0.7–3.1)
Lymphs: 31 %
MCH: 32.3 pg (ref 26.6–33.0)
MCHC: 34.3 g/dL (ref 31.5–35.7)
MCV: 94 fL (ref 79–97)
Monocytes Absolute: 0.9 10*3/uL (ref 0.1–0.9)
Monocytes: 9 %
Neutrophils Absolute: 6.1 10*3/uL (ref 1.4–7.0)
Neutrophils: 59 %
Platelets: 332 10*3/uL (ref 150–379)
RBC: 4.89 x10E6/uL (ref 3.77–5.28)
RDW: 12.7 % (ref 12.3–15.4)
WBC: 10.5 10*3/uL (ref 3.4–10.8)

## 2015-07-24 LAB — TSH: TSH: 1.03 u[IU]/mL (ref 0.450–4.500)

## 2015-07-24 LAB — URIC ACID: Uric Acid: 5.6 mg/dL (ref 2.5–7.1)

## 2015-07-24 LAB — VITAMIN D 25 HYDROXY (VIT D DEFICIENCY, FRACTURES): Vit D, 25-Hydroxy: 24.4 ng/mL — ABNORMAL LOW (ref 30.0–100.0)

## 2015-08-01 DIAGNOSIS — G4733 Obstructive sleep apnea (adult) (pediatric): Secondary | ICD-10-CM | POA: Diagnosis not present

## 2015-08-23 ENCOUNTER — Other Ambulatory Visit: Payer: Self-pay | Admitting: Unknown Physician Specialty

## 2015-08-23 DIAGNOSIS — M7072 Other bursitis of hip, left hip: Secondary | ICD-10-CM | POA: Diagnosis not present

## 2015-08-23 DIAGNOSIS — M545 Low back pain: Secondary | ICD-10-CM | POA: Diagnosis not present

## 2015-08-23 DIAGNOSIS — M461 Sacroiliitis, not elsewhere classified: Secondary | ICD-10-CM | POA: Diagnosis not present

## 2015-09-03 DIAGNOSIS — M25552 Pain in left hip: Secondary | ICD-10-CM | POA: Diagnosis not present

## 2015-09-03 DIAGNOSIS — M533 Sacrococcygeal disorders, not elsewhere classified: Secondary | ICD-10-CM | POA: Diagnosis not present

## 2015-10-08 ENCOUNTER — Other Ambulatory Visit: Payer: Self-pay

## 2015-10-08 ENCOUNTER — Telehealth: Payer: Self-pay

## 2015-10-08 NOTE — Telephone Encounter (Signed)
Patient called in and stated that she is having a UTI/bladder infection. States she has some burning and pressure, and that she periodically gets them (states it should be on her record). [facility] [address]

## 2015-10-08 NOTE — Telephone Encounter (Signed)
She would need to be seen. Thanks 

## 2015-10-08 NOTE — Telephone Encounter (Signed)
Tried calling patient, no answer, left voicemail letting patient know she needed to be seen and gave her our callback number so she could schedule an appointment.

## 2015-10-09 ENCOUNTER — Ambulatory Visit (INDEPENDENT_AMBULATORY_CARE_PROVIDER_SITE_OTHER): Payer: BLUE CROSS/BLUE SHIELD | Admitting: Family Medicine

## 2015-10-09 ENCOUNTER — Encounter: Payer: Self-pay | Admitting: Family Medicine

## 2015-10-09 VITALS — BP 130/85 | HR 84 | Temp 98.2°F | Ht 65.0 in | Wt 215.0 lb

## 2015-10-09 DIAGNOSIS — N390 Urinary tract infection, site not specified: Secondary | ICD-10-CM | POA: Diagnosis not present

## 2015-10-09 DIAGNOSIS — R399 Unspecified symptoms and signs involving the genitourinary system: Secondary | ICD-10-CM | POA: Diagnosis not present

## 2015-10-09 MED ORDER — CIPROFLOXACIN HCL 250 MG PO TABS: 250.0000 mg | ORAL_TABLET | Freq: Two times a day (BID) | ORAL | 0 refills | Status: DC

## 2015-10-09 MED ORDER — PHENAZOPYRIDINE HCL 200 MG PO TABS: 200.0000 mg | ORAL_TABLET | Freq: Three times a day (TID) | ORAL | 0 refills | Status: DC | PRN

## 2015-10-09 NOTE — Progress Notes (Signed)
   BP 130/85   Pulse 84   Temp 98.2 F (36.8 C)   Ht 5\' 5"  (1.651 m)   Wt 215 lb (97.5 kg)   LMP  (LMP Unknown)   SpO2 99%   BMI 35.78 kg/m    Subjective:    Patient ID: Carrie Arroyo, female    DOB: 1960-11-24, 55 y.o.   MRN: [id number]  HPI: Carrie Bostonlice E Haefele is a 55 y.o. female  Chief Complaint  Patient presents with  . Urinary Tract Infection    x 2 weeks; pressure, pain, urgency, some burning.    Patient presents with 2 week history of dysuria, pelvic pressure, urgency, and chills. Symptoms have been about the same since onset. Has taken 3 rounds of AZO with good symptomatic relief. Denies fever, back pain, nausea, vomiting, or hematuria.  Relevant past medical, surgical, family and social history reviewed and updated as indicated. Interim medical history since our last visit reviewed. Allergies and medications reviewed and updated.  Review of Systems  Constitutional: Positive for chills. Negative for fever.  HENT: Negative.   Respiratory: Negative.   Cardiovascular: Negative.   Genitourinary: Positive for dysuria, frequency, pelvic pain and urgency.  Musculoskeletal: Negative.   Neurological: Negative.   Psychiatric/Behavioral: Negative.     Per HPI unless specifically indicated above     Objective:    BP 130/85   Pulse 84   Temp 98.2 F (36.8 C)   Ht 5\' 5"  (1.651 m)   Wt 215 lb (97.5 kg)   LMP  (LMP Unknown)   SpO2 99%   BMI 35.78 kg/m   Wt Readings from Last 3 Encounters:  10/09/15 215 lb (97.5 kg)  07/23/15 212 lb 9.6 oz (96.4 kg)  04/28/15 210 lb (95.3 kg)    Physical Exam  Constitutional: She is oriented to person, place, and time. She appears well-developed and well-nourished.  HENT:  Head: Atraumatic.  Eyes: Conjunctivae are normal. No scleral icterus.  Neck: Neck supple.  Cardiovascular: Normal rate and normal heart sounds.   Pulmonary/Chest: Effort normal and breath sounds normal. No respiratory distress.  Abdominal: Soft. Bowel sounds are  normal. She exhibits no distension. There is tenderness (suprapubic tenderness).  Musculoskeletal: Normal range of motion.  Neurological: She is alert and oriented to person, place, and time.  Skin: Skin is warm and dry.  Psychiatric: She has a normal mood and affect. Her behavior is normal.  Nursing note and vitals reviewed.       Assessment & Plan:   Problem List Items Addressed This Visit    None    Visit Diagnoses    Lower urinary tract infection    -  Primary   Pyridium and cipro sent. Encouraged lots of water. She will follow up for a U/A in 3 days if no better.    Relevant Medications   phenazopyridine (PYRIDIUM) 200 MG tablet   Other Relevant Orders   UA/M w/rflx Culture, Routine (STAT)       Follow up plan: Return if symptoms worsen or fail to improve.

## 2015-10-09 NOTE — Patient Instructions (Signed)
Follow up as needed

## 2015-10-10 LAB — UA/M W/RFLX CULTURE, ROUTINE
Bilirubin, UA: NEGATIVE
Glucose, UA: NEGATIVE
Ketones, UA: NEGATIVE
Nitrite, UA: NEGATIVE
Protein, UA: NEGATIVE
Specific Gravity, UA: 1.005 (ref 1.005–1.030)
Urobilinogen, Ur: 0.2 mg/dL (ref 0.2–1.0)
pH, UA: 7 (ref 5.0–7.5)

## 2015-10-10 LAB — MICROSCOPIC EXAMINATION

## 2015-10-10 LAB — URINE CULTURE, REFLEX

## 2015-10-12 ENCOUNTER — Telehealth: Payer: Self-pay | Admitting: Family Medicine

## 2015-10-12 MED ORDER — NITROFURANTOIN MONOHYD MACRO 100 MG PO CAPS: 100.0000 mg | ORAL_CAPSULE | Freq: Two times a day (BID) | ORAL | 0 refills | Status: DC

## 2015-10-12 NOTE — Telephone Encounter (Signed)
Macrobid sent, if still symptomatic after that she needs to come in and give another

## 2015-10-12 NOTE — Telephone Encounter (Signed)
Routing to provider  

## 2015-10-12 NOTE — Telephone Encounter (Signed)
Patient notified

## 2015-10-30 DIAGNOSIS — Z1231 Encounter for screening mammogram for malignant neoplasm of breast: Secondary | ICD-10-CM | POA: Diagnosis not present

## 2015-11-13 ENCOUNTER — Other Ambulatory Visit: Payer: Self-pay | Admitting: Unknown Physician Specialty

## 2015-11-22 DIAGNOSIS — D485 Neoplasm of uncertain behavior of skin: Secondary | ICD-10-CM | POA: Diagnosis not present

## 2015-11-22 DIAGNOSIS — X32XXXA Exposure to sunlight, initial encounter: Secondary | ICD-10-CM | POA: Diagnosis not present

## 2015-11-22 DIAGNOSIS — L821 Other seborrheic keratosis: Secondary | ICD-10-CM | POA: Diagnosis not present

## 2015-11-22 DIAGNOSIS — I788 Other diseases of capillaries: Secondary | ICD-10-CM | POA: Diagnosis not present

## 2015-11-22 DIAGNOSIS — L820 Inflamed seborrheic keratosis: Secondary | ICD-10-CM | POA: Diagnosis not present

## 2015-11-22 DIAGNOSIS — L728 Other follicular cysts of the skin and subcutaneous tissue: Secondary | ICD-10-CM | POA: Diagnosis not present

## 2015-11-22 DIAGNOSIS — L732 Hidradenitis suppurativa: Secondary | ICD-10-CM | POA: Diagnosis not present

## 2015-11-22 DIAGNOSIS — D225 Melanocytic nevi of trunk: Secondary | ICD-10-CM | POA: Diagnosis not present

## 2015-11-22 DIAGNOSIS — L570 Actinic keratosis: Secondary | ICD-10-CM | POA: Diagnosis not present

## 2015-11-30 ENCOUNTER — Telehealth: Payer: Self-pay

## 2015-11-30 MED ORDER — BENAZEPRIL HCL 20 MG PO TABS: 20.0000 mg | ORAL_TABLET | Freq: Every day | ORAL | 3 refills | Status: DC

## 2015-11-30 NOTE — Telephone Encounter (Signed)
[facility] is requesting a 90 day Rx for  Benazepril 20mg  Tab

## 2015-12-24 DIAGNOSIS — D110 Benign neoplasm of parotid gland: Secondary | ICD-10-CM | POA: Diagnosis not present

## 2016-01-28 ENCOUNTER — Ambulatory Visit (INDEPENDENT_AMBULATORY_CARE_PROVIDER_SITE_OTHER): Payer: BLUE CROSS/BLUE SHIELD | Admitting: Unknown Physician Specialty

## 2016-01-28 ENCOUNTER — Encounter: Payer: Self-pay | Admitting: Unknown Physician Specialty

## 2016-01-28 VITALS — BP 121/85 | HR 99 | Temp 97.8°F | Ht 64.7 in | Wt 209.2 lb

## 2016-01-28 DIAGNOSIS — I10 Essential (primary) hypertension: Secondary | ICD-10-CM | POA: Diagnosis not present

## 2016-01-28 DIAGNOSIS — E7800 Pure hypercholesterolemia, unspecified: Secondary | ICD-10-CM | POA: Diagnosis not present

## 2016-01-28 DIAGNOSIS — J0100 Acute maxillary sinusitis, unspecified: Secondary | ICD-10-CM

## 2016-01-28 DIAGNOSIS — R7301 Impaired fasting glucose: Secondary | ICD-10-CM | POA: Diagnosis not present

## 2016-01-28 LAB — BAYER DCA HB A1C WAIVED: HB A1C (BAYER DCA - WAIVED): 6 % (ref <7.0)

## 2016-01-28 MED ORDER — AZITHROMYCIN 250 MG PO TABS: ORAL_TABLET | ORAL | 0 refills | Status: DC

## 2016-01-28 NOTE — Progress Notes (Signed)
BP 121/85 (BP Location: Left Arm, Cuff Size: Large)   Pulse 99   Temp 97.8 F (36.6 C)   Ht 5' 4.7" (1.643 m)   Wt 209 lb 3.2 oz (94.9 kg)   LMP  (LMP Unknown)   SpO2 98%   BMI 35.14 kg/m    Subjective:    Patient ID: Carrie Arroyo, female    DOB: Apr 02, 1960, 55 y.o.   MRN: [id number]  HPI: Carrie Arroyo is a 55 y.o. female  Chief Complaint  Patient presents with  . Hyperlipidemia  . Hypertension   Hypertension Using medications without difficulty Average home BPs     120's/78            No problems or lightheadedness No chest pain with exertion or shortness of breath No Edema   Hyperlipidemia Using medications without problems: No Muscle aches  Diet compliance: "OK" Exercise: "could be better"  Sinusitis  This is a new problem. The current episode started in the past 7 days. The problem is unchanged. Associated symptoms include congestion, coughing and a sore throat. Past treatments include oral decongestants. The treatment provided mild relief.    Relevant past medical, surgical, family and social history reviewed and updated as indicated. Interim medical history since our last visit reviewed. Allergies and medications reviewed and updated.  Review of Systems  HENT: Positive for congestion and sore throat.   Respiratory: Positive for cough.     Per HPI unless specifically indicated above     Objective:    BP 121/85 (BP Location: Left Arm, Cuff Size: Large)   Pulse 99   Temp 97.8 F (36.6 C)   Ht 5' 4.7" (1.643 m)   Wt 209 lb 3.2 oz (94.9 kg)   LMP  (LMP Unknown)   SpO2 98%   BMI 35.14 kg/m   Wt Readings from Last 3 Encounters:  01/28/16 209 lb 3.2 oz (94.9 kg)  10/09/15 215 lb (97.5 kg)  07/23/15 212 lb 9.6 oz (96.4 kg)    Physical Exam  Constitutional: She is oriented to person, place, and time. She appears well-developed and well-nourished. No distress.  HENT:  Head: Normocephalic and atraumatic.  Right Ear: Tympanic membrane and ear  canal normal.  Left Ear: Tympanic membrane and ear canal normal.  Nose: No rhinorrhea. Right sinus exhibits maxillary sinus tenderness. Right sinus exhibits no frontal sinus tenderness. Left sinus exhibits maxillary sinus tenderness. Left sinus exhibits no frontal sinus tenderness.  Eyes: Conjunctivae and lids are normal. Right eye exhibits no discharge. Left eye exhibits no discharge. No scleral icterus.  Neck: Normal range of motion. Neck supple. No JVD present. Carotid bruit is not present.  Cardiovascular: Normal rate, regular rhythm and normal heart sounds.   Pulmonary/Chest: Effort normal and breath sounds normal. No respiratory distress.  Abdominal: Normal appearance. There is no splenomegaly or hepatomegaly.  Musculoskeletal: Normal range of motion.  Neurological: She is alert and oriented to person, place, and time.  Skin: Skin is warm, dry and intact. No rash noted. No pallor.  Psychiatric: She has a normal mood and affect. Her behavior is normal. Judgment and thought content normal.    Results for orders placed or performed in visit on 10/09/15  Microscopic Examination  Result Value Ref Range   WBC, UA 3-5 0 - 5 /hpf   RBC, UA 3-10 (A) 0 - 2 /hpf   Epithelial Cells (non renal) 0-10 0 - 10 /hpf   Bacteria, UA Few None seen/Few  UA/M  w/rflx Culture, Routine (STAT)  Result Value Ref Range   Specific Gravity, UA 1.005 1.005 - 1.030   pH, UA 7.0 5.0 - 7.5   Color, UA Yellow Yellow   Appearance Ur Clear Clear   Leukocytes, UA Trace (A) Negative   Protein, UA Negative Negative/Trace   Glucose, UA Negative Negative   Ketones, UA Negative Negative   RBC, UA Trace (A) Negative   Bilirubin, UA Negative Negative   Urobilinogen, Ur 0.2 0.2 - 1.0 mg/dL   Nitrite, UA Negative Negative   Microscopic Examination See below:    Urinalysis Reflex Comment   Urine Culture, Routine  Result Value Ref Range   Urine Culture, Routine Final report    Urine Culture result 1 Comment         Assessment & Plan:   Problem List Items Addressed This Visit      Unprioritized   Hyperlipidemia    Check lipid panel      Relevant Orders   Lipid Panel w/o Chol/HDL Ratio   Comprehensive metabolic panel   Hypertension    Stable, continue present medications.        Relevant Orders   Comprehensive metabolic panel   Impaired fasting glucose    Check Hgb A1C      Relevant Orders   Bayer DCA Hb A1c Waived    Other Visit Diagnoses    Acute non-recurrent maxillary sinusitis    -  Primary   Relevant Medications   azithromycin (ZITHROMAX) 250 MG tablet       Follow up plan: Return in about 6 months (around 07/27/2016) for physical.

## 2016-01-28 NOTE — Assessment & Plan Note (Signed)
Lost some weight since last visit.  Continue lifestyle changes

## 2016-01-28 NOTE — Assessment & Plan Note (Signed)
Stable, continue present medications.   

## 2016-01-28 NOTE — Assessment & Plan Note (Signed)
Check Hgb A1C 

## 2016-01-28 NOTE — Assessment & Plan Note (Signed)
Check lipid panel  

## 2016-01-29 LAB — COMPREHENSIVE METABOLIC PANEL
ALT: 52 IU/L — ABNORMAL HIGH (ref 0–32)
AST: 36 IU/L (ref 0–40)
Albumin/Globulin Ratio: 2 (ref 1.2–2.2)
Albumin: 4.7 g/dL (ref 3.5–5.5)
Alkaline Phosphatase: 104 IU/L (ref 39–117)
BUN/Creatinine Ratio: 18 (ref 9–23)
BUN: 12 mg/dL (ref 6–24)
Bilirubin Total: 0.3 mg/dL (ref 0.0–1.2)
CO2: 21 mmol/L (ref 18–29)
Calcium: 9.7 mg/dL (ref 8.7–10.2)
Chloride: 102 mmol/L (ref 96–106)
Creatinine, Ser: 0.66 mg/dL (ref 0.57–1.00)
GFR calc Af Amer: 115 mL/min/{1.73_m2} (ref >59)
GFR calc non Af Amer: 100 mL/min/{1.73_m2} (ref >59)
Globulin, Total: 2.4 g/dL (ref 1.5–4.5)
Glucose: 105 mg/dL — ABNORMAL HIGH (ref 65–99)
Potassium: 4.5 mmol/L (ref 3.5–5.2)
Sodium: 141 mmol/L (ref 134–144)
Total Protein: 7.1 g/dL (ref 6.0–8.5)

## 2016-01-29 LAB — LIPID PANEL W/O CHOL/HDL RATIO
Cholesterol, Total: 189 mg/dL (ref 100–199)
HDL: 44 mg/dL (ref >39)
LDL Calculated: 118 mg/dL — ABNORMAL HIGH (ref 0–99)
Triglycerides: 133 mg/dL (ref 0–149)
VLDL Cholesterol Cal: 27 mg/dL (ref 5–40)

## 2016-02-13 ENCOUNTER — Other Ambulatory Visit: Payer: Self-pay | Admitting: Unknown Physician Specialty

## 2016-04-14 DIAGNOSIS — G4733 Obstructive sleep apnea (adult) (pediatric): Secondary | ICD-10-CM | POA: Diagnosis not present

## 2016-05-25 DIAGNOSIS — M25511 Pain in right shoulder: Secondary | ICD-10-CM | POA: Diagnosis not present

## 2016-05-25 DIAGNOSIS — W010XXA Fall on same level from slipping, tripping and stumbling without subsequent striking against object, initial encounter: Secondary | ICD-10-CM | POA: Diagnosis not present

## 2016-05-25 DIAGNOSIS — S79912A Unspecified injury of left hip, initial encounter: Secondary | ICD-10-CM | POA: Diagnosis not present

## 2016-05-25 DIAGNOSIS — M79601 Pain in right arm: Secondary | ICD-10-CM | POA: Diagnosis not present

## 2016-05-25 DIAGNOSIS — S4991XA Unspecified injury of right shoulder and upper arm, initial encounter: Secondary | ICD-10-CM | POA: Diagnosis not present

## 2016-05-25 DIAGNOSIS — M25552 Pain in left hip: Secondary | ICD-10-CM | POA: Diagnosis not present

## 2016-05-26 DIAGNOSIS — S42201A Unspecified fracture of upper end of right humerus, initial encounter for closed fracture: Secondary | ICD-10-CM | POA: Diagnosis not present

## 2016-05-26 DIAGNOSIS — Z09 Encounter for follow-up examination after completed treatment for conditions other than malignant neoplasm: Secondary | ICD-10-CM | POA: Diagnosis not present

## 2016-05-26 DIAGNOSIS — S42254A Nondisplaced fracture of greater tuberosity of right humerus, initial encounter for closed fracture: Secondary | ICD-10-CM | POA: Diagnosis not present

## 2016-06-02 DIAGNOSIS — S42254D Nondisplaced fracture of greater tuberosity of right humerus, subsequent encounter for fracture with routine healing: Secondary | ICD-10-CM | POA: Diagnosis not present

## 2016-06-19 DIAGNOSIS — S42254D Nondisplaced fracture of greater tuberosity of right humerus, subsequent encounter for fracture with routine healing: Secondary | ICD-10-CM | POA: Diagnosis not present

## 2016-06-19 DIAGNOSIS — B372 Candidiasis of skin and nail: Secondary | ICD-10-CM | POA: Diagnosis not present

## 2016-07-01 DIAGNOSIS — S42254D Nondisplaced fracture of greater tuberosity of right humerus, subsequent encounter for fracture with routine healing: Secondary | ICD-10-CM | POA: Diagnosis not present

## 2016-07-01 DIAGNOSIS — M6281 Muscle weakness (generalized): Secondary | ICD-10-CM | POA: Diagnosis not present

## 2016-07-08 DIAGNOSIS — S42254D Nondisplaced fracture of greater tuberosity of right humerus, subsequent encounter for fracture with routine healing: Secondary | ICD-10-CM | POA: Diagnosis not present

## 2016-07-08 DIAGNOSIS — M6281 Muscle weakness (generalized): Secondary | ICD-10-CM | POA: Diagnosis not present

## 2016-07-17 DIAGNOSIS — M6281 Muscle weakness (generalized): Secondary | ICD-10-CM | POA: Diagnosis not present

## 2016-07-17 DIAGNOSIS — S42254D Nondisplaced fracture of greater tuberosity of right humerus, subsequent encounter for fracture with routine healing: Secondary | ICD-10-CM | POA: Diagnosis not present

## 2016-07-21 DIAGNOSIS — S42254D Nondisplaced fracture of greater tuberosity of right humerus, subsequent encounter for fracture with routine healing: Secondary | ICD-10-CM | POA: Diagnosis not present

## 2016-07-22 DIAGNOSIS — M6281 Muscle weakness (generalized): Secondary | ICD-10-CM | POA: Diagnosis not present

## 2016-07-22 DIAGNOSIS — S42254D Nondisplaced fracture of greater tuberosity of right humerus, subsequent encounter for fracture with routine healing: Secondary | ICD-10-CM | POA: Diagnosis not present

## 2016-07-31 DIAGNOSIS — S42254D Nondisplaced fracture of greater tuberosity of right humerus, subsequent encounter for fracture with routine healing: Secondary | ICD-10-CM | POA: Diagnosis not present

## 2016-07-31 DIAGNOSIS — M6281 Muscle weakness (generalized): Secondary | ICD-10-CM | POA: Diagnosis not present

## 2016-08-04 ENCOUNTER — Ambulatory Visit (INDEPENDENT_AMBULATORY_CARE_PROVIDER_SITE_OTHER): Payer: BLUE CROSS/BLUE SHIELD | Admitting: Unknown Physician Specialty

## 2016-08-04 ENCOUNTER — Encounter: Payer: Self-pay | Admitting: Unknown Physician Specialty

## 2016-08-04 VITALS — BP 117/82 | HR 92 | Temp 98.4°F | Ht 65.0 in | Wt 203.4 lb

## 2016-08-04 DIAGNOSIS — Z0000 Encounter for general adult medical examination without abnormal findings: Secondary | ICD-10-CM | POA: Diagnosis not present

## 2016-08-04 DIAGNOSIS — I10 Essential (primary) hypertension: Secondary | ICD-10-CM | POA: Diagnosis not present

## 2016-08-04 DIAGNOSIS — E8881 Metabolic syndrome: Secondary | ICD-10-CM

## 2016-08-04 DIAGNOSIS — E7800 Pure hypercholesterolemia, unspecified: Secondary | ICD-10-CM | POA: Diagnosis not present

## 2016-08-04 NOTE — Assessment & Plan Note (Signed)
Stable, continue present medications.   

## 2016-08-04 NOTE — Assessment & Plan Note (Signed)
Pt working on weight loss and has steadily lost weight over the past year

## 2016-08-04 NOTE — Progress Notes (Signed)
BP 117/82   Pulse 92   Temp 98.4 F (36.9 C)   Ht 5\' 5"  (1.651 m)   Wt 203 lb 6.4 oz (92.3 kg)   LMP  (LMP Unknown)   SpO2 97%   BMI 33.85 kg/m    Subjective:    Patient ID: Carrie Arroyo, female    DOB: 07-19-1960, 56 y.o.   MRN: [id number]  HPI: Carrie Arroyo is a 56 y.o. female  Chief Complaint  Patient presents with  . Annual Exam   Since last see fractured humerus March 25th.  Healing without surgery.  Bone density determined to be doing well  Hypertension Using medications without difficulty Average home BPs: good numbers   No problems or lightheadedness No chest pain with exertion or shortness of breath No Edema   Hyperlipidemia Using medications without problems: No Muscle aches  Diet compliance:Exercise:Doing well  GERD Stable on present medications.    Family History  Problem Relation Age of Onset  . Adopted: Yes  . Family history unknown: Yes   Social History   Social History  . Marital status: Married    Spouse name: N/A  . Number of children: N/A  . Years of education: N/A   Occupational History  . Not on file.   Social History Main Topics  . Smoking status: Never Smoker  . Smokeless tobacco: Never Used  . Alcohol use 1.2 oz/week    2 Glasses of wine per week  . Drug use: No  . Sexual activity: No   Other Topics Concern  . Not on file   Social History Narrative  . No narrative on file   Past Medical History:  Diagnosis Date  . Allergy   . Boils   . Elevated liver enzymes   . Humerus fracture    right  . Hypertension   . Sleep apnea    Past Surgical History:  Procedure Laterality Date  . ABDOMINAL HYSTERECTOMY    . APPENDECTOMY    . OVARIAN CYST REMOVAL    . PAROTIDECTOMY       elevant past medical, surgical, family and social history reviewed and updated as indicated. Interim medical history since our last visit reviewed. Allergies and medications reviewed and updated.  Review of Systems  Constitutional:  Negative.   HENT: Negative.   Eyes: Negative.   Respiratory: Negative.   Cardiovascular: Negative.   Gastrointestinal: Negative.   Endocrine: Negative.   Genitourinary: Negative.   Musculoskeletal: Negative.   Skin: Negative.   Allergic/Immunologic: Negative.   Neurological: Negative.   Hematological: Negative.   Psychiatric/Behavioral: Negative.     Per HPI unless specifically indicated above     Objective:    BP 117/82   Pulse 92   Temp 98.4 F (36.9 C)   Ht 5\' 5"  (1.651 m)   Wt 203 lb 6.4 oz (92.3 kg)   LMP  (LMP Unknown)   SpO2 97%   BMI 33.85 kg/m   Wt Readings from Last 3 Encounters:  08/04/16 203 lb 6.4 oz (92.3 kg)  01/28/16 209 lb 3.2 oz (94.9 kg)  10/09/15 215 lb (97.5 kg)    Physical Exam  Constitutional: She is oriented to person, place, and time. She appears well-developed and well-nourished.  HENT:  Head: Normocephalic and atraumatic.  Eyes: Pupils are equal, round, and reactive to light. Right eye exhibits no discharge. Left eye exhibits no discharge. No scleral icterus.  Neck: Normal range of motion. Neck supple. Carotid bruit is not  present. No thyromegaly present.  Cardiovascular: Normal rate, regular rhythm and normal heart sounds.  Exam reveals no gallop and no friction rub.   No murmur heard. Pulmonary/Chest: Effort normal and breath sounds normal. No respiratory distress. She has no wheezes. She has no rales.  Abdominal: Soft. Bowel sounds are normal. There is no tenderness. There is no rebound.  Genitourinary: No breast swelling, tenderness or discharge.  Musculoskeletal: Normal range of motion.  Lymphadenopathy:    She has no cervical adenopathy.  Neurological: She is alert and oriented to person, place, and time.  Skin: Skin is warm, dry and intact. No rash noted.  Psychiatric: She has a normal mood and affect. Her speech is normal and behavior is normal. Judgment and thought content normal. Cognition and memory are normal.         Assessment & Plan:   Problem List Items Addressed This Visit      Unprioritized   Hyperlipidemia    Stable, continue present medications.        Relevant Orders   Lipid Panel w/o Chol/HDL Ratio   Hypertension    Stable, continue present medications.        Relevant Orders   Comprehensive metabolic panel   Uric acid   Metabolic syndrome    Pt working on weight loss and has steadily lost weight over the past year       Other Visit Diagnoses    Annual physical exam    -  Primary   Relevant Orders   CBC with Differential/Platelet   TSH       Follow up plan: Return in about 6 months (around 02/03/2017).

## 2016-08-05 LAB — CBC WITH DIFFERENTIAL/PLATELET
Basophils Absolute: 0 10*3/uL (ref 0.0–0.2)
Basos: 0 %
EOS (ABSOLUTE): 0.2 10*3/uL (ref 0.0–0.4)
Eos: 2 %
Hematocrit: 45.3 % (ref 34.0–46.6)
Hemoglobin: 15.2 g/dL (ref 11.1–15.9)
Immature Grans (Abs): 0 10*3/uL (ref 0.0–0.1)
Immature Granulocytes: 0 %
Lymphocytes Absolute: 3.5 10*3/uL — ABNORMAL HIGH (ref 0.7–3.1)
Lymphs: 30 %
MCH: 31.9 pg (ref 26.6–33.0)
MCHC: 33.6 g/dL (ref 31.5–35.7)
MCV: 95 fL (ref 79–97)
Monocytes Absolute: 1.1 10*3/uL — ABNORMAL HIGH (ref 0.1–0.9)
Monocytes: 9 %
Neutrophils Absolute: 7 10*3/uL (ref 1.4–7.0)
Neutrophils: 59 %
Platelets: 339 10*3/uL (ref 150–379)
RBC: 4.77 x10E6/uL (ref 3.77–5.28)
RDW: 12.5 % (ref 12.3–15.4)
WBC: 11.9 10*3/uL — ABNORMAL HIGH (ref 3.4–10.8)

## 2016-08-05 LAB — TSH: TSH: 1.19 u[IU]/mL (ref 0.450–4.500)

## 2016-08-05 LAB — COMPREHENSIVE METABOLIC PANEL
ALT: 42 IU/L — ABNORMAL HIGH (ref 0–32)
AST: 38 IU/L (ref 0–40)
Albumin/Globulin Ratio: 2.2 (ref 1.2–2.2)
Albumin: 4.8 g/dL (ref 3.5–5.5)
Alkaline Phosphatase: 100 IU/L (ref 39–117)
BUN/Creatinine Ratio: 12 (ref 9–23)
BUN: 8 mg/dL (ref 6–24)
Bilirubin Total: 0.5 mg/dL (ref 0.0–1.2)
CO2: 20 mmol/L (ref 18–29)
Calcium: 10 mg/dL (ref 8.7–10.2)
Chloride: 98 mmol/L (ref 96–106)
Creatinine, Ser: 0.66 mg/dL (ref 0.57–1.00)
GFR calc Af Amer: 115 mL/min/{1.73_m2} (ref >59)
GFR calc non Af Amer: 100 mL/min/{1.73_m2} (ref >59)
Globulin, Total: 2.2 g/dL (ref 1.5–4.5)
Glucose: 102 mg/dL — ABNORMAL HIGH (ref 65–99)
Potassium: 4.2 mmol/L (ref 3.5–5.2)
Sodium: 137 mmol/L (ref 134–144)
Total Protein: 7 g/dL (ref 6.0–8.5)

## 2016-08-05 LAB — LIPID PANEL W/O CHOL/HDL RATIO
Cholesterol, Total: 177 mg/dL (ref 100–199)
HDL: 44 mg/dL (ref >39)
LDL Calculated: 109 mg/dL — ABNORMAL HIGH (ref 0–99)
Triglycerides: 121 mg/dL (ref 0–149)
VLDL Cholesterol Cal: 24 mg/dL (ref 5–40)

## 2016-08-05 LAB — URIC ACID: Uric Acid: 5.1 mg/dL (ref 2.5–7.1)

## 2016-08-05 NOTE — Progress Notes (Signed)
Normal labs.  Pt notified through mychart

## 2016-08-06 DIAGNOSIS — M25511 Pain in right shoulder: Secondary | ICD-10-CM | POA: Diagnosis not present

## 2016-08-06 DIAGNOSIS — R29898 Other symptoms and signs involving the musculoskeletal system: Secondary | ICD-10-CM | POA: Diagnosis not present

## 2016-08-06 DIAGNOSIS — S42254D Nondisplaced fracture of greater tuberosity of right humerus, subsequent encounter for fracture with routine healing: Secondary | ICD-10-CM | POA: Diagnosis not present

## 2016-08-06 DIAGNOSIS — M25611 Stiffness of right shoulder, not elsewhere classified: Secondary | ICD-10-CM | POA: Diagnosis not present

## 2016-08-09 ENCOUNTER — Other Ambulatory Visit: Payer: Self-pay | Admitting: Unknown Physician Specialty

## 2016-08-18 DIAGNOSIS — S42254D Nondisplaced fracture of greater tuberosity of right humerus, subsequent encounter for fracture with routine healing: Secondary | ICD-10-CM | POA: Diagnosis not present

## 2016-08-18 DIAGNOSIS — M25611 Stiffness of right shoulder, not elsewhere classified: Secondary | ICD-10-CM | POA: Diagnosis not present

## 2016-10-06 DIAGNOSIS — M25611 Stiffness of right shoulder, not elsewhere classified: Secondary | ICD-10-CM | POA: Diagnosis not present

## 2016-10-06 DIAGNOSIS — S42254D Nondisplaced fracture of greater tuberosity of right humerus, subsequent encounter for fracture with routine healing: Secondary | ICD-10-CM | POA: Diagnosis not present

## 2016-10-13 DIAGNOSIS — S46111A Strain of muscle, fascia and tendon of long head of biceps, right arm, initial encounter: Secondary | ICD-10-CM | POA: Diagnosis not present

## 2016-10-13 DIAGNOSIS — S46011A Strain of muscle(s) and tendon(s) of the rotator cuff of right shoulder, initial encounter: Secondary | ICD-10-CM | POA: Diagnosis not present

## 2016-10-13 DIAGNOSIS — M7581 Other shoulder lesions, right shoulder: Secondary | ICD-10-CM | POA: Diagnosis not present

## 2016-10-13 DIAGNOSIS — S42251D Displaced fracture of greater tuberosity of right humerus, subsequent encounter for fracture with routine healing: Secondary | ICD-10-CM | POA: Diagnosis not present

## 2016-10-15 DIAGNOSIS — M25611 Stiffness of right shoulder, not elsewhere classified: Secondary | ICD-10-CM | POA: Diagnosis not present

## 2016-10-15 DIAGNOSIS — M25511 Pain in right shoulder: Secondary | ICD-10-CM | POA: Diagnosis not present

## 2016-10-21 DIAGNOSIS — M25611 Stiffness of right shoulder, not elsewhere classified: Secondary | ICD-10-CM | POA: Diagnosis not present

## 2016-10-21 DIAGNOSIS — M25511 Pain in right shoulder: Secondary | ICD-10-CM | POA: Diagnosis not present

## 2016-10-28 DIAGNOSIS — M25511 Pain in right shoulder: Secondary | ICD-10-CM | POA: Diagnosis not present

## 2016-10-28 DIAGNOSIS — M25611 Stiffness of right shoulder, not elsewhere classified: Secondary | ICD-10-CM | POA: Diagnosis not present

## 2016-10-29 DIAGNOSIS — G4733 Obstructive sleep apnea (adult) (pediatric): Secondary | ICD-10-CM | POA: Diagnosis not present

## 2016-11-06 DIAGNOSIS — Z1231 Encounter for screening mammogram for malignant neoplasm of breast: Secondary | ICD-10-CM | POA: Diagnosis not present

## 2016-11-06 LAB — HM MAMMOGRAPHY

## 2016-11-18 DIAGNOSIS — R29898 Other symptoms and signs involving the musculoskeletal system: Secondary | ICD-10-CM | POA: Diagnosis not present

## 2016-11-18 DIAGNOSIS — M25611 Stiffness of right shoulder, not elsewhere classified: Secondary | ICD-10-CM | POA: Diagnosis not present

## 2016-11-24 DIAGNOSIS — M7541 Impingement syndrome of right shoulder: Secondary | ICD-10-CM | POA: Diagnosis not present

## 2016-11-24 DIAGNOSIS — M25611 Stiffness of right shoulder, not elsewhere classified: Secondary | ICD-10-CM | POA: Diagnosis not present

## 2016-11-25 DIAGNOSIS — D225 Melanocytic nevi of trunk: Secondary | ICD-10-CM | POA: Diagnosis not present

## 2016-11-25 DIAGNOSIS — L439 Lichen planus, unspecified: Secondary | ICD-10-CM | POA: Diagnosis not present

## 2016-11-25 DIAGNOSIS — L570 Actinic keratosis: Secondary | ICD-10-CM | POA: Diagnosis not present

## 2016-11-25 DIAGNOSIS — D485 Neoplasm of uncertain behavior of skin: Secondary | ICD-10-CM | POA: Diagnosis not present

## 2016-11-25 DIAGNOSIS — L821 Other seborrheic keratosis: Secondary | ICD-10-CM | POA: Diagnosis not present

## 2016-11-25 DIAGNOSIS — L732 Hidradenitis suppurativa: Secondary | ICD-10-CM | POA: Diagnosis not present

## 2016-11-25 DIAGNOSIS — L728 Other follicular cysts of the skin and subcutaneous tissue: Secondary | ICD-10-CM | POA: Diagnosis not present

## 2016-11-27 ENCOUNTER — Other Ambulatory Visit: Payer: Self-pay | Admitting: Family Medicine

## 2016-12-02 DIAGNOSIS — S42254D Nondisplaced fracture of greater tuberosity of right humerus, subsequent encounter for fracture with routine healing: Secondary | ICD-10-CM | POA: Diagnosis not present

## 2016-12-02 DIAGNOSIS — M25611 Stiffness of right shoulder, not elsewhere classified: Secondary | ICD-10-CM | POA: Diagnosis not present

## 2016-12-02 DIAGNOSIS — R29898 Other symptoms and signs involving the musculoskeletal system: Secondary | ICD-10-CM | POA: Diagnosis not present

## 2016-12-02 DIAGNOSIS — M25511 Pain in right shoulder: Secondary | ICD-10-CM | POA: Diagnosis not present

## 2016-12-16 ENCOUNTER — Other Ambulatory Visit: Payer: Self-pay | Admitting: Unknown Physician Specialty

## 2016-12-25 DIAGNOSIS — J301 Allergic rhinitis due to pollen: Secondary | ICD-10-CM | POA: Diagnosis not present

## 2016-12-25 DIAGNOSIS — D110 Benign neoplasm of parotid gland: Secondary | ICD-10-CM | POA: Diagnosis not present

## 2017-02-03 ENCOUNTER — Ambulatory Visit: Payer: BLUE CROSS/BLUE SHIELD | Admitting: Unknown Physician Specialty

## 2017-02-10 ENCOUNTER — Other Ambulatory Visit: Payer: Self-pay | Admitting: Family Medicine

## 2017-02-11 ENCOUNTER — Ambulatory Visit: Payer: BLUE CROSS/BLUE SHIELD | Admitting: Unknown Physician Specialty

## 2017-03-13 ENCOUNTER — Encounter: Payer: Self-pay | Admitting: Unknown Physician Specialty

## 2017-03-13 ENCOUNTER — Ambulatory Visit: Payer: BLUE CROSS/BLUE SHIELD | Admitting: Unknown Physician Specialty

## 2017-03-13 DIAGNOSIS — I10 Essential (primary) hypertension: Secondary | ICD-10-CM | POA: Diagnosis not present

## 2017-03-13 DIAGNOSIS — E7800 Pure hypercholesterolemia, unspecified: Secondary | ICD-10-CM

## 2017-03-13 DIAGNOSIS — R7301 Impaired fasting glucose: Secondary | ICD-10-CM | POA: Diagnosis not present

## 2017-03-13 NOTE — Assessment & Plan Note (Signed)
Will work on losing some recent pounds following the holidays

## 2017-03-13 NOTE — Assessment & Plan Note (Signed)
Stable, continue present medications.   

## 2017-03-13 NOTE — Progress Notes (Signed)
   BP 139/79   Pulse 83   Temp 97.7 F (36.5 C) (Oral)   Ht 5' 4.6" (1.641 m)   Wt 208 lb 8 oz (94.6 kg)   LMP  (LMP Unknown)   SpO2 98%   BMI 35.13 kg/m    Subjective:    Patient ID: Carrie Arroyo, female    DOB: [birth year], 57 y.o.   MRN: [id number]  HPI: Carrie Bostonlice E Mohamad is a 57 y.o. female  Chief Complaint  Patient presents with  . Hyperlipidemia  . Hypertension   Hypertension Using medications without difficulty Average home BPs below 140/90   No problems or lightheadedness No chest pain with exertion or shortness of breath No Edema  Hyperlipidemia Using medications without problems: No Muscle aches  Diet compliance:Exercise:  "Getting back on track   Relevant past medical, surgical, family and social history reviewed and updated as indicated. Interim medical history since our last visit reviewed. Allergies and medications reviewed and updated.  Review of Systems  Per HPI unless specifically indicated above     Objective:    BP 139/79   Pulse 83   Temp 97.7 F (36.5 C) (Oral)   Ht 5' 4.6" (1.641 m)   Wt 208 lb 8 oz (94.6 kg)   LMP  (LMP Unknown)   SpO2 98%   BMI 35.13 kg/m   Wt Readings from Last 3 Encounters:  03/13/17 208 lb 8 oz (94.6 kg)  08/04/16 203 lb 6.4 oz (92.3 kg)  01/28/16 209 lb 3.2 oz (94.9 kg)    Physical Exam  Constitutional: She is oriented to person, place, and time. She appears well-developed and well-nourished. No distress.  HENT:  Head: Normocephalic and atraumatic.  Eyes: Conjunctivae and lids are normal. Right eye exhibits no discharge. Left eye exhibits no discharge. No scleral icterus.  Neck: Normal range of motion. Neck supple. No JVD present. Carotid bruit is not present.  Cardiovascular: Normal rate, regular rhythm and normal heart sounds.  Pulmonary/Chest: Effort normal and breath sounds normal.  Abdominal: Normal appearance. There is no splenomegaly or hepatomegaly.  Musculoskeletal: Normal range of motion.    Neurological: She is alert and oriented to person, place, and time.  Skin: Skin is warm, dry and intact. No rash noted. No pallor.  Psychiatric: She has a normal mood and affect. Her behavior is normal. Judgment and thought content normal.      Assessment & Plan:   Problem List Items Addressed This Visit      Unprioritized   Hyperlipidemia    Stable, continue present medications.        Hypertension    Stable, continue present medications.        Impaired fasting glucose    Will work on losing some recent pounds following the holidays          Follow up plan: Return in about 6 months (around 09/10/2017) for physical.

## 2017-03-18 ENCOUNTER — Other Ambulatory Visit: Payer: Self-pay | Admitting: Family Medicine

## 2017-03-22 ENCOUNTER — Other Ambulatory Visit: Payer: Self-pay | Admitting: Family Medicine

## 2017-03-24 DIAGNOSIS — R52 Pain, unspecified: Secondary | ICD-10-CM | POA: Diagnosis not present

## 2017-03-24 DIAGNOSIS — L853 Xerosis cutis: Secondary | ICD-10-CM | POA: Diagnosis not present

## 2017-03-24 DIAGNOSIS — X32XXXA Exposure to sunlight, initial encounter: Secondary | ICD-10-CM | POA: Diagnosis not present

## 2017-03-24 DIAGNOSIS — L810 Postinflammatory hyperpigmentation: Secondary | ICD-10-CM | POA: Diagnosis not present

## 2017-03-24 DIAGNOSIS — L570 Actinic keratosis: Secondary | ICD-10-CM | POA: Diagnosis not present

## 2017-03-24 DIAGNOSIS — D485 Neoplasm of uncertain behavior of skin: Secondary | ICD-10-CM | POA: Diagnosis not present

## 2017-04-07 DIAGNOSIS — G4733 Obstructive sleep apnea (adult) (pediatric): Secondary | ICD-10-CM | POA: Diagnosis not present

## 2017-04-22 DIAGNOSIS — L821 Other seborrheic keratosis: Secondary | ICD-10-CM | POA: Diagnosis not present

## 2017-04-22 DIAGNOSIS — D0472 Carcinoma in situ of skin of left lower limb, including hip: Secondary | ICD-10-CM | POA: Diagnosis not present

## 2017-04-22 DIAGNOSIS — L281 Prurigo nodularis: Secondary | ICD-10-CM | POA: Diagnosis not present

## 2017-05-21 ENCOUNTER — Other Ambulatory Visit: Payer: Self-pay | Admitting: Family Medicine

## 2017-05-21 NOTE — Telephone Encounter (Signed)
simvastatin refill Last OV: 03/13/17 Last Refill:02/10/18 #90 [facility] [address] PCP: Carrie Arroyo

## 2017-05-25 DIAGNOSIS — S60222A Contusion of left hand, initial encounter: Secondary | ICD-10-CM | POA: Diagnosis not present

## 2017-05-25 DIAGNOSIS — R52 Pain, unspecified: Secondary | ICD-10-CM | POA: Diagnosis not present

## 2017-07-28 ENCOUNTER — Telehealth: Payer: BLUE CROSS/BLUE SHIELD | Admitting: Family

## 2017-07-28 DIAGNOSIS — N390 Urinary tract infection, site not specified: Secondary | ICD-10-CM

## 2017-07-28 MED ORDER — NITROFURANTOIN MONOHYD MACRO 100 MG PO CAPS: 100.0000 mg | ORAL_CAPSULE | Freq: Two times a day (BID) | ORAL | 0 refills | Status: DC

## 2017-07-28 NOTE — Progress Notes (Signed)

## 2017-08-15 ENCOUNTER — Other Ambulatory Visit: Payer: Self-pay | Admitting: Family Medicine

## 2017-08-17 NOTE — Telephone Encounter (Signed)
Last refill on Simvastatin ; 05/21/17; # 90; no refills Last office visit 03/13/17; next OV 09/15/2017 PCP: Gabriel Cirriheryl Wicker [facility] [address] [place]   medication refilled per protocol

## 2017-09-10 ENCOUNTER — Other Ambulatory Visit: Payer: Self-pay | Admitting: Family Medicine

## 2017-09-14 ENCOUNTER — Other Ambulatory Visit: Payer: Self-pay

## 2017-09-14 DIAGNOSIS — M2390 Unspecified internal derangement of unspecified knee: Secondary | ICD-10-CM | POA: Insufficient documentation

## 2017-09-14 DIAGNOSIS — M25569 Pain in unspecified knee: Secondary | ICD-10-CM | POA: Insufficient documentation

## 2017-09-15 ENCOUNTER — Ambulatory Visit (INDEPENDENT_AMBULATORY_CARE_PROVIDER_SITE_OTHER): Payer: BLUE CROSS/BLUE SHIELD | Admitting: Unknown Physician Specialty

## 2017-09-15 ENCOUNTER — Other Ambulatory Visit: Payer: Self-pay

## 2017-09-15 ENCOUNTER — Encounter: Payer: Self-pay | Admitting: Unknown Physician Specialty

## 2017-09-15 VITALS — BP 119/76 | HR 80 | Temp 98.2°F | Ht 64.57 in | Wt 204.5 lb

## 2017-09-15 DIAGNOSIS — I10 Essential (primary) hypertension: Secondary | ICD-10-CM

## 2017-09-15 DIAGNOSIS — Z0000 Encounter for general adult medical examination without abnormal findings: Secondary | ICD-10-CM

## 2017-09-15 DIAGNOSIS — E7800 Pure hypercholesterolemia, unspecified: Secondary | ICD-10-CM | POA: Diagnosis not present

## 2017-09-15 LAB — MICROSCOPIC EXAMINATION: Bacteria, UA: NONE SEEN

## 2017-09-15 LAB — UA/M W/RFLX CULTURE, ROUTINE
Bilirubin, UA: NEGATIVE
Glucose, UA: NEGATIVE
Ketones, UA: NEGATIVE
Leukocytes, UA: NEGATIVE
Nitrite, UA: NEGATIVE
Protein, UA: NEGATIVE
Specific Gravity, UA: <1.005 — ABNORMAL LOW (ref 1.005–1.030)
Urobilinogen, Ur: 0.2 mg/dL (ref 0.2–1.0)
pH, UA: 7 (ref 5.0–7.5)

## 2017-09-15 NOTE — Progress Notes (Signed)
BP 119/76   Pulse 80   Temp 98.2 F (36.8 C) (Oral)   Ht 5' 4.57" (1.64 m)   Wt 204 lb 8 oz (92.8 kg)   LMP  (LMP Unknown)   SpO2 100%   BMI 34.49 kg/m    Subjective:    Patient ID: Carrie Arroyo, female    DOB: 1960-08-11, 57 y.o.   MRN: [id number]  HPI: Carrie Arroyo is a 57 y.o. female  Chief Complaint  Patient presents with  . Annual Exam   GERD Takes Omeprazole daily.  No problems with reflux while on Omeprazole    Hypertension Using medications without difficulty Average home BPs 120's/68-80   No problems or lightheadedness No chest pain with exertion or shortness of breath No Edema   Hyperlipidemia Using medications without problems: No Muscle aches  Diet compliance:Exercise: "pretty good"    Pt would like to consider stopping both BP and cholesterol medications.    The 10-year ASCVD risk score Denman George DC Montez Hageman., et al., 2013) is: 2.8%   Values used to calculate the score:     Age: 9 years     Sex: Female     Is Non-Hispanic African American: No     Diabetic: No     Tobacco smoker: No     Systolic Blood Pressure: 119 mmHg     Is BP treated: Yes     HDL Cholesterol: 44 mg/dL     Total Cholesterol: 177 mg/dL    Depression screen [facility] 2/9 09/15/2017 08/04/2016 07/23/2015  Decreased Interest 0 0 0  Down, Depressed, Hopeless 0 0 0  PHQ - 2 Score 0 0 0  Altered sleeping 0 - -  Tired, decreased energy 0 - -  Change in appetite 0 - -  Feeling bad or failure about yourself  0 - -  Trouble concentrating 0 - -  Moving slowly or fidgety/restless 0 - -  Suicidal thoughts 0 - -  PHQ-9 Score 0 - -   Social History   Socioeconomic History  . Marital status: Married    Spouse name: Not on file  . Number of children: Not on file  . Years of education: Not on file  . Highest education level: Not on file  Occupational History  . Not on file  Social Needs  . Financial resource strain: Not on file  . Food insecurity:    Worry: Not on file    Inability: Not  on file  . Transportation needs:    Medical: Not on file    Non-medical: Not on file  Tobacco Use  . Smoking status: Never Smoker  . Smokeless tobacco: Never Used  Substance and Sexual Activity  . Alcohol use: Yes    Alcohol/week: 1.2 oz    Types: 2 Glasses of wine per week  . Drug use: No  . Sexual activity: Never  Lifestyle  . Physical activity:    Days per week: Not on file    Minutes per session: Not on file  . Stress: Not on file  Relationships  . Social connections:    Talks on phone: Not on file    Gets together: Not on file    Attends religious service: Not on file    Active member of club or organization: Not on file    Attends meetings of clubs or organizations: Not on file    Relationship status: Not on file  . Intimate partner violence:    Fear of current  or ex partner: Not on file    Emotionally abused: Not on file    Physically abused: Not on file    Forced sexual activity: Not on file  Other Topics Concern  . Not on file  Social History Narrative  . Not on file   Family History  Adopted: Yes  Family history unknown: Yes   Past Medical History:  Diagnosis Date  . Allergy   . Boils   . Elevated liver enzymes   . Humerus fracture    right  . Hypertension   . Sleep apnea    Past Surgical History:  Procedure Laterality Date  . ABDOMINAL HYSTERECTOMY    . APPENDECTOMY    . OVARIAN CYST REMOVAL    . PAROTIDECTOMY       Relevant past medical, surgical, family and social history reviewed and updated as indicated. Interim medical history since our last visit reviewed. Allergies and medications reviewed and updated.  Review of Systems  Per HPI unless specifically indicated above     Objective:    BP 119/76   Pulse 80   Temp 98.2 F (36.8 C) (Oral)   Ht 5' 4.57" (1.64 m)   Wt 204 lb 8 oz (92.8 kg)   LMP  (LMP Unknown)   SpO2 100%   BMI 34.49 kg/m   Wt Readings from Last 3 Encounters:  09/15/17 204 lb 8 oz (92.8 kg)  03/13/17 208 lb 8  oz (94.6 kg)  08/04/16 203 lb 6.4 oz (92.3 kg)    Physical Exam  Constitutional: She is oriented to person, place, and time. She appears well-developed and well-nourished.  HENT:  Head: Normocephalic and atraumatic.  Eyes: Pupils are equal, round, and reactive to light. Right eye exhibits no discharge. Left eye exhibits no discharge. No scleral icterus.  Neck: Normal range of motion. Neck supple. Carotid bruit is not present. No thyromegaly present.  Cardiovascular: Normal rate, regular rhythm and normal heart sounds. Exam reveals no gallop and no friction rub.  No murmur heard. Pulmonary/Chest: Effort normal and breath sounds normal. No respiratory distress. She has no wheezes. She has no rales. No breast tenderness or discharge.  Abdominal: Soft. Bowel sounds are normal. There is no tenderness. There is no rebound.  Genitourinary: No breast tenderness or discharge.  Musculoskeletal: Normal range of motion.  Lymphadenopathy:    She has no cervical adenopathy.  Neurological: She is alert and oriented to person, place, and time.  Skin: Skin is warm, dry and intact. No rash noted.  Psychiatric: She has a normal mood and affect. Her speech is normal and behavior is normal. Judgment and thought content normal. Cognition and memory are normal.    Results for orders placed or performed in visit on 11/10/16  HM MAMMOGRAPHY  Result Value Ref Range   HM Mammogram 0-4 Bi-Rad 0-4 Bi-Rad, Self Reported Normal      Assessment & Plan:   Problem List Items Addressed This Visit      Unprioritized   Hyperlipidemia    Check lipid panel today and adjust Simvastatin accordingly.  Pt would like to come off of medication.      Relevant Orders   Lipid Panel w/o Chol/HDL Ratio   Hypertension    Stable, continue present medications.        Relevant Orders   UA/M w/rflx Culture, Routine   Comprehensive metabolic panel    Other Visit Diagnoses    Annual physical exam    -  Primary  Relevant  Orders   CBC with Differential/Platelet   Hepatitis C antibody   HIV antibody   TSH      Risks and benefits of medications discussed.    Follow up plan: Return in about 6 months (around 03/18/2018).  Sooner if wants to consider weaning off of medications.

## 2017-09-15 NOTE — Assessment & Plan Note (Addendum)
Check lipid panel today and adjust Simvastatin accordingly.  Pt would like to come off of medication.

## 2017-09-15 NOTE — Assessment & Plan Note (Signed)
Stable, continue present medications.   

## 2017-09-16 ENCOUNTER — Other Ambulatory Visit: Payer: Self-pay | Admitting: Unknown Physician Specialty

## 2017-09-16 DIAGNOSIS — R7989 Other specified abnormal findings of blood chemistry: Secondary | ICD-10-CM

## 2017-09-16 LAB — COMPREHENSIVE METABOLIC PANEL
ALT: 42 IU/L — ABNORMAL HIGH (ref 0–32)
AST: 34 IU/L (ref 0–40)
Albumin/Globulin Ratio: 2.2 (ref 1.2–2.2)
Albumin: 4.7 g/dL (ref 3.5–5.5)
Alkaline Phosphatase: 99 IU/L (ref 39–117)
BUN/Creatinine Ratio: 13 (ref 9–23)
BUN: 9 mg/dL (ref 6–24)
Bilirubin Total: 0.4 mg/dL (ref 0.0–1.2)
CO2: 21 mmol/L (ref 20–29)
Calcium: 9.8 mg/dL (ref 8.7–10.2)
Chloride: 103 mmol/L (ref 96–106)
Creatinine, Ser: 0.68 mg/dL (ref 0.57–1.00)
GFR calc Af Amer: 113 mL/min/{1.73_m2} (ref >59)
GFR calc non Af Amer: 98 mL/min/{1.73_m2} (ref >59)
Globulin, Total: 2.1 g/dL (ref 1.5–4.5)
Glucose: 91 mg/dL (ref 65–99)
Potassium: 4.1 mmol/L (ref 3.5–5.2)
Sodium: 143 mmol/L (ref 134–144)
Total Protein: 6.8 g/dL (ref 6.0–8.5)

## 2017-09-16 LAB — CBC WITH DIFFERENTIAL/PLATELET
Basophils Absolute: 0 10*3/uL (ref 0.0–0.2)
Basos: 0 %
EOS (ABSOLUTE): 0.2 10*3/uL (ref 0.0–0.4)
Eos: 2 %
Hematocrit: 43.8 % (ref 34.0–46.6)
Hemoglobin: 14.9 g/dL (ref 11.1–15.9)
Immature Grans (Abs): 0 10*3/uL (ref 0.0–0.1)
Immature Granulocytes: 0 %
Lymphocytes Absolute: 3.1 10*3/uL (ref 0.7–3.1)
Lymphs: 35 %
MCH: 31.9 pg (ref 26.6–33.0)
MCHC: 34 g/dL (ref 31.5–35.7)
MCV: 94 fL (ref 79–97)
Monocytes Absolute: 0.8 10*3/uL (ref 0.1–0.9)
Monocytes: 9 %
Neutrophils Absolute: 4.8 10*3/uL (ref 1.4–7.0)
Neutrophils: 54 %
Platelets: 331 10*3/uL (ref 150–450)
RBC: 4.67 x10E6/uL (ref 3.77–5.28)
RDW: 13.4 % (ref 12.3–15.4)
WBC: 8.9 10*3/uL (ref 3.4–10.8)

## 2017-09-16 LAB — TSH: TSH: 0.35 u[IU]/mL — ABNORMAL LOW (ref 0.450–4.500)

## 2017-09-16 LAB — LIPID PANEL W/O CHOL/HDL RATIO
Cholesterol, Total: 181 mg/dL (ref 100–199)
HDL: 41 mg/dL (ref >39)
LDL Calculated: 109 mg/dL — ABNORMAL HIGH (ref 0–99)
Triglycerides: 154 mg/dL — ABNORMAL HIGH (ref 0–149)
VLDL Cholesterol Cal: 31 mg/dL (ref 5–40)

## 2017-09-16 LAB — HEPATITIS C ANTIBODY: Hep C Virus Ab: <0.1 s/co ratio (ref 0.0–0.9)

## 2017-09-16 LAB — HIV ANTIBODY (ROUTINE TESTING W REFLEX): HIV Screen 4th Generation wRfx: NONREACTIVE

## 2017-09-16 NOTE — Progress Notes (Unsigned)
Noted, thank you

## 2017-09-16 NOTE — Progress Notes (Signed)
Hi Carrie Arroyo, here are your labs.  They look good.  Your thyroid test came back looking a little overactive.  This may cause heart palpitations, weight loss and insomnia.  I'd like to recheck this in 1-2 months.  I will put an order in and all you need to do is come back in to recheck it.  Your other labs are similar to previous results

## 2017-10-27 DIAGNOSIS — G4733 Obstructive sleep apnea (adult) (pediatric): Secondary | ICD-10-CM | POA: Diagnosis not present

## 2017-11-09 DIAGNOSIS — Z1231 Encounter for screening mammogram for malignant neoplasm of breast: Secondary | ICD-10-CM | POA: Diagnosis not present

## 2017-11-09 LAB — HM MAMMOGRAPHY

## 2017-11-30 DIAGNOSIS — X32XXXA Exposure to sunlight, initial encounter: Secondary | ICD-10-CM | POA: Diagnosis not present

## 2017-11-30 DIAGNOSIS — D225 Melanocytic nevi of trunk: Secondary | ICD-10-CM | POA: Diagnosis not present

## 2017-11-30 DIAGNOSIS — L570 Actinic keratosis: Secondary | ICD-10-CM | POA: Diagnosis not present

## 2017-12-12 ENCOUNTER — Other Ambulatory Visit: Payer: Self-pay | Admitting: Unknown Physician Specialty

## 2017-12-29 ENCOUNTER — Telehealth: Payer: Self-pay

## 2017-12-29 NOTE — Telephone Encounter (Signed)
Called and left patient a VM asking for her to please stop by the office to have her thyroid rechecked per [facility].

## 2017-12-29 NOTE — Telephone Encounter (Signed)
-----   Message from Dorcas Carrow, DO sent at 12/29/2017 10:02 AM EDT ----- Needs to come in for repeat TSH ----- Message ----- From: SYSTEM Sent: 11/22/2017  12:08 AM EDT To: Gabriel Cirri, NP

## 2017-12-29 NOTE — Progress Notes (Signed)
Called and left a message letting patient know that she needs to have blood work done.

## 2017-12-30 ENCOUNTER — Other Ambulatory Visit: Payer: BLUE CROSS/BLUE SHIELD

## 2017-12-30 DIAGNOSIS — R7989 Other specified abnormal findings of blood chemistry: Secondary | ICD-10-CM | POA: Diagnosis not present

## 2017-12-30 DIAGNOSIS — Z1329 Encounter for screening for other suspected endocrine disorder: Secondary | ICD-10-CM | POA: Diagnosis not present

## 2017-12-31 DIAGNOSIS — J019 Acute sinusitis, unspecified: Secondary | ICD-10-CM | POA: Diagnosis not present

## 2017-12-31 DIAGNOSIS — D3705 Neoplasm of uncertain behavior of pharynx: Secondary | ICD-10-CM | POA: Diagnosis not present

## 2017-12-31 DIAGNOSIS — D110 Benign neoplasm of parotid gland: Secondary | ICD-10-CM | POA: Diagnosis not present

## 2017-12-31 DIAGNOSIS — K121 Other forms of stomatitis: Secondary | ICD-10-CM | POA: Diagnosis not present

## 2017-12-31 LAB — THYROID PANEL WITH TSH
Free Thyroxine Index: 2.1 (ref 1.2–4.9)
T3 Uptake Ratio: 27 % (ref 24–39)
T4, Total: 7.8 ug/dL (ref 4.5–12.0)
TSH: 1.07 u[IU]/mL (ref 0.450–4.500)

## 2018-02-19 ENCOUNTER — Other Ambulatory Visit: Payer: Self-pay | Admitting: *Deleted

## 2018-02-19 ENCOUNTER — Other Ambulatory Visit: Payer: Self-pay | Admitting: Unknown Physician Specialty

## 2018-02-19 MED ORDER — SIMVASTATIN 20 MG PO TABS: ORAL_TABLET | ORAL | 1 refills | Status: DC

## 2018-02-19 NOTE — Progress Notes (Signed)
Requested Prescriptions  Pending Prescriptions Disp Refills  . simvastatin (ZOCOR) 20 MG tablet 90 tablet 1    Sig: TAKE 1 TABLET BY MOUTH EVERY DAY AT BEDTIME AS DIRECTED     There is no refill protocol information for this order

## 2018-03-05 ENCOUNTER — Telehealth: Payer: Self-pay | Admitting: Unknown Physician Specialty

## 2018-03-05 NOTE — Telephone Encounter (Signed)
-----   Message from Richarda Overlie, [place] sent at 12/25/2017 12:47 PM EDT ----- Regarding: RE: Needs Thyroid checked Patient states she can come by next week to have TSH checked. Please place TSH order. ----- Message ----- From: Gabriel Cirri, NP Sent: 12/25/2017  12:06 PM EDT To: [facility] Subject: Needs Thyroid checked                          overdue ----- Message ----- From: SYSTEM Sent: 11/22/2017  12:08 AM EDT To: Gabriel Cirri, NP

## 2018-03-05 NOTE — Telephone Encounter (Signed)
Please call pt and let her know she needs a TSH.  Order is in

## 2018-03-08 NOTE — Telephone Encounter (Signed)
Does not appear to have been routed. Routing.

## 2018-03-08 NOTE — Telephone Encounter (Signed)
Looks like she had it drawn in October. OK to disregard.

## 2018-03-08 NOTE — Telephone Encounter (Signed)
First message is from October. Does she need to come have another drawn, she had one since then? Please advise.

## 2018-03-10 ENCOUNTER — Other Ambulatory Visit: Payer: Self-pay | Admitting: Unknown Physician Specialty

## 2018-04-04 ENCOUNTER — Encounter: Payer: Self-pay | Admitting: Nurse Practitioner

## 2018-04-04 DIAGNOSIS — K219 Gastro-esophageal reflux disease without esophagitis: Secondary | ICD-10-CM | POA: Insufficient documentation

## 2018-04-06 ENCOUNTER — Encounter: Payer: Self-pay | Admitting: Nurse Practitioner

## 2018-04-06 ENCOUNTER — Ambulatory Visit: Payer: BLUE CROSS/BLUE SHIELD | Admitting: Nurse Practitioner

## 2018-04-06 VITALS — BP 108/73 | HR 87 | Temp 98.7°F | Ht 64.57 in | Wt 204.4 lb

## 2018-04-06 DIAGNOSIS — K219 Gastro-esophageal reflux disease without esophagitis: Secondary | ICD-10-CM

## 2018-04-06 DIAGNOSIS — E7800 Pure hypercholesterolemia, unspecified: Secondary | ICD-10-CM

## 2018-04-06 DIAGNOSIS — R7301 Impaired fasting glucose: Secondary | ICD-10-CM

## 2018-04-06 DIAGNOSIS — J40 Bronchitis, not specified as acute or chronic: Secondary | ICD-10-CM

## 2018-04-06 DIAGNOSIS — I10 Essential (primary) hypertension: Secondary | ICD-10-CM | POA: Diagnosis not present

## 2018-04-06 DIAGNOSIS — E559 Vitamin D deficiency, unspecified: Secondary | ICD-10-CM | POA: Diagnosis not present

## 2018-04-06 MED ORDER — BENZONATATE 200 MG PO CAPS: 200.0000 mg | ORAL_CAPSULE | Freq: Two times a day (BID) | ORAL | 0 refills | Status: DC | PRN

## 2018-04-06 MED ORDER — PREDNISONE 10 MG PO TABS: 30.0000 mg | ORAL_TABLET | Freq: Every day | ORAL | 0 refills | Status: AC

## 2018-04-06 MED ORDER — AZITHROMYCIN 250 MG PO TABS: ORAL_TABLET | ORAL | 0 refills | Status: DC

## 2018-04-06 NOTE — Progress Notes (Signed)
BP 108/73 (BP Location: Left Arm, Patient Position: Sitting, Cuff Size: Large)   Pulse 87   Temp 98.7 F (37.1 C)   Ht 5' 4.57" (1.64 m)   Wt 204 lb 6 oz (92.7 kg)   LMP  (LMP Unknown)   SpO2 100%   BMI 34.46 kg/m    Subjective:    Patient ID: Carrie Arroyo, female    DOB: 06-Mar-1960, 58 y.o.   MRN: [id number]  HPI: Carrie Arroyo is a 58 y.o. female  Chief Complaint  Patient presents with  . Hypertension  . Hyperlipidemia    HYPERTENSION / HYPERLIPIDEMIA Satisfied with current treatment? yes Duration of hypertension: chronic BP monitoring frequency: rarely BP range: 120/80 range at home BP medication side effects: no Duration of hyperlipidemia: chronic Cholesterol medication side effects: no Cholesterol supplements: none Medication compliance: good compliance Aspirin: yes Recent stressors: no Recurrent headaches: no Visual changes: no Palpitations: no Dyspnea: no Chest pain: no Lower extremity edema: no Dizzy/lightheaded: no  The 10-year ASCVD risk score Denman George DC Jr., et al., 2013) is: 2.7%   Values used to calculate the score:     Age: 36 years     Sex: Female     Is Non-Hispanic African American: No     Diabetic: No     Tobacco smoker: No     Systolic Blood Pressure: 108 mmHg     Is BP treated: Yes     HDL Cholesterol: 41 mg/dL     Total Cholesterol: 181 mg/dL   VITAMIN D DEFICIENCY: Continues on daily multivitamin.  No recent Vitamin D level, last level in 2017 was 24.4.  No recent falls or fractures reported.  Denies muscle pain.    IMPAIRED FASTING GLUCOSE: On review glucose on labs 102-105 in past with July 2019 level 91.  No recent A1C in chart.  Focused on diet at this time.    GERD GERD control status: controlled  Satisfied with current treatment? yes Heartburn frequency:  none Medication side effects: no  Medication compliance: fluctuating Previous GERD medications: Antacid use frequency:  none Duration:  Nature:  Location:    Heartburn duration:  Alleviatiating factors:   Aggravating factors:  Dysphagia: no Odynophagia:  no Hematemesis: no Blood in stool: no EGD: no  UPPER RESPIRATORY TRACT INFECTION Cough present 3-4 weeks, feels it is on "its way out, but still is here".  She is a nonsmoker. Worst symptom: cough Fever: no Cough: yes Shortness of breath: no Wheezing: yes Chest pain: no Chest tightness: yes Chest congestion: yes Nasal congestion: yes Runny nose: yes Post nasal drip: yes Sneezing: no Sore throat: no Swollen glands: no Sinus pressure: no Headache: no Face pain: no Toothache: no Ear pain: none Ear pressure: yes bilateral Eyes red/itching:no Eye drainage/crusting: no  Vomiting: no Rash: no Fatigue: yes Sick contacts: yes Strep contacts: no  Context: fluctuating Recurrent sinusitis: no Relief with OTC cold/cough medications: no  Treatments attempted: mucinex and anti-histamine    Relevant past medical, surgical, family and social history reviewed and updated as indicated. Interim medical history since our last visit reviewed. Allergies and medications reviewed and updated.  Review of Systems  Constitutional: Positive for fatigue. Negative for activity change, appetite change, diaphoresis and fever.  HENT: Positive for congestion, postnasal drip and rhinorrhea. Negative for ear discharge, ear pain, sinus pressure, sinus pain, sneezing, sore throat and voice change.   Eyes: Negative for pain and visual disturbance.  Respiratory: Positive for cough and chest tightness. Negative  for shortness of breath.   Cardiovascular: Negative for chest pain, palpitations and leg swelling.  Gastrointestinal: Negative for abdominal distention, abdominal pain, constipation, diarrhea, nausea and vomiting.  Endocrine: Negative for cold intolerance, heat intolerance, polydipsia, polyphagia and polyuria.  Neurological: Negative for dizziness, syncope, weakness, light-headedness, numbness and  headaches.  Psychiatric/Behavioral: Negative.     Per HPI unless specifically indicated above     Objective:    BP 108/73 (BP Location: Left Arm, Patient Position: Sitting, Cuff Size: Large)   Pulse 87   Temp 98.7 F (37.1 C)   Ht 5' 4.57" (1.64 m)   Wt 204 lb 6 oz (92.7 kg)   LMP  (LMP Unknown)   SpO2 100%   BMI 34.46 kg/m   Wt Readings from Last 3 Encounters:  04/06/18 204 lb 6 oz (92.7 kg)  09/15/17 204 lb 8 oz (92.8 kg)  03/13/17 208 lb 8 oz (94.6 kg)    Physical Exam Vitals signs and nursing note reviewed.  Constitutional:      General: She is awake.     Appearance: She is well-developed.  HENT:     Head: Normocephalic. No raccoon eyes.     Right Ear: Hearing, ear canal and external ear normal. No drainage. A middle ear effusion is present.     Left Ear: Hearing, ear canal and external ear normal. No drainage. A middle ear effusion is present.     Nose: Mucosal edema and rhinorrhea present. Rhinorrhea is clear.     Right Sinus: No maxillary sinus tenderness or frontal sinus tenderness.     Left Sinus: No maxillary sinus tenderness or frontal sinus tenderness.     Mouth/Throat:     Mouth: Mucous membranes are moist.     Pharynx: No pharyngeal swelling, oropharyngeal exudate or posterior oropharyngeal erythema.  Eyes:     General: Lids are normal.        Right eye: No discharge.        Left eye: No discharge.     Conjunctiva/sclera: Conjunctivae normal.     Pupils: Pupils are equal, round, and reactive to light.  Neck:     Musculoskeletal: Normal range of motion and neck supple.     Thyroid: No thyromegaly.     Vascular: No carotid bruit or JVD.  Cardiovascular:     Rate and Rhythm: Normal rate and regular rhythm.     Heart sounds: Normal heart sounds. No murmur. No gallop.   Pulmonary:     Effort: Pulmonary effort is normal.     Breath sounds: Examination of the right-upper field reveals wheezing. Examination of the left-upper field reveals wheezing. Wheezing  present.     Comments: Upper wheezes expiratory present.  Clear bases. Abdominal:     General: Bowel sounds are normal.     Palpations: Abdomen is soft. There is no hepatomegaly or splenomegaly.  Musculoskeletal:     Right lower leg: No edema.     Left lower leg: No edema.  Lymphadenopathy:     Cervical: No cervical adenopathy.  Skin:    General: Skin is warm and dry.  Neurological:     Mental Status: She is alert and oriented to person, place, and time.  Psychiatric:        Attention and Perception: Attention normal.        Mood and Affect: Mood normal.        Behavior: Behavior normal. Behavior is cooperative.        Thought Content: Thought  content normal.        Judgment: Judgment normal.     Results for orders placed or performed in visit on 12/30/17  Thyroid Panel With TSH  Result Value Ref Range   TSH 1.070 0.450 - 4.500 uIU/mL   T4, Total 7.8 4.5 - 12.0 ug/dL   T3 Uptake Ratio 27 24 - 39 %   Free Thyroxine Index 2.1 1.2 - 4.9      Assessment & Plan:   Problem List Items Addressed This Visit      Cardiovascular and Mediastinum   Hypertension - Primary    Chronic, ongoing.  Continue current medication regimen.  CMP today.      Relevant Orders   Comprehensive metabolic panel     Respiratory   Bronchitis    Acute with cough present 3-4 weeks.  Based on physical exam will initiate Zpack and Prednisone, script sent.  Tessalon for cough.  Recommend increase fluid intake and rest.  Return for worsening or continued symptoms.  Refuses imaging today.        Digestive   GERD (gastroesophageal reflux disease)    Chronic, ongoing.  Continue current medication regimen.  Mag level today.      Relevant Orders   Magnesium     Endocrine   Impaired fasting glucose    Chronic, ongoing.  Will obtain baseline A1C today.  Continue focus on diet and exercise.      Relevant Orders   HgB A1c     Other   Vitamin D deficiency    Chronic, ongoing.  Continue  multivitamin.  Vit D level today.      Relevant Orders   VITAMIN D 25 Hydroxy (Vit-D Deficiency, Fractures)   Hyperlipidemia    Chronic, ongoing.  Continue current medication regimen.  Lipid panel today and adjust statin as needed.      Relevant Orders   Lipid Panel w/o Chol/HDL Ratio       Follow up plan: Return in about 6 months (around 10/05/2018) for HTN/HLD.

## 2018-04-06 NOTE — Assessment & Plan Note (Signed)
Focus on diet and regular exercise (30 minutes 5 days a week).

## 2018-04-06 NOTE — Assessment & Plan Note (Signed)
Chronic, ongoing.  Continue current medication regimen.  CMP today. 

## 2018-04-06 NOTE — Assessment & Plan Note (Signed)
Chronic, ongoing.  Continue multivitamin.  Vit D level today.

## 2018-04-06 NOTE — Assessment & Plan Note (Signed)
Chronic, ongoing.  Continue current medication regimen.  Lipid panel today and adjust statin as needed. 

## 2018-04-06 NOTE — Assessment & Plan Note (Signed)
Acute with cough present 3-4 weeks.  Based on physical exam will initiate Zpack and Prednisone, script sent.  Tessalon for cough.  Recommend increase fluid intake and rest.  Return for worsening or continued symptoms.  Refuses imaging today.

## 2018-04-06 NOTE — Patient Instructions (Signed)
Vitamin D Deficiency  Vitamin D deficiency is when your body does not have enough vitamin D. Vitamin D is important because:   It helps your body use other minerals that your body needs.   It helps keep your bones strong and healthy.   It may help to prevent some diseases.   It helps your heart and other muscles work well.  You can get vitamin D by:   Eating foods with vitamin D in them.   Drinking or eating milk or other foods that have had vitamin D added to them.   Taking a vitamin D supplement.   Being in the sun.  Not getting enough vitamin D can make your bones become soft. It can also cause other health problems.  Follow these instructions at home:   Take medicines and supplements only as told by your doctor.   Eat foods that have vitamin D. These include:  ? Dairy products, cereals, or juices with added vitamin D. Check the label for vitamin D.  ? Fatty fish like salmon or trout.  ? Eggs.  ? Oysters.   Do not use tanning beds.   Stay at a healthy weight. Lose weight, if needed.   Keep all follow-up visits as told by your doctor. This is important.  Contact a doctor if:   Your symptoms do not go away.   You feel sick to your stomach (nauseous).   Youthrow up (vomit).   You poop less often than usual or you have trouble pooping (constipation).  This information is not intended to replace advice given to you by your health care provider. Make sure you discuss any questions you have with your health care provider.  Document Released: 02/06/2011 Document Revised: 07/26/2015 Document Reviewed: 07/05/2014  Elsevier Interactive Patient Education  2019 [employer]

## 2018-04-06 NOTE — Assessment & Plan Note (Signed)
Chronic, ongoing.  Continue current medication regimen.  Mag level today. 

## 2018-04-06 NOTE — Assessment & Plan Note (Signed)
Chronic, ongoing.  Will obtain baseline A1C today.  Continue focus on diet and exercise.

## 2018-04-07 ENCOUNTER — Encounter: Payer: Self-pay | Admitting: Nurse Practitioner

## 2018-04-07 LAB — COMPREHENSIVE METABOLIC PANEL
ALT: 37 IU/L — ABNORMAL HIGH (ref 0–32)
AST: 29 IU/L (ref 0–40)
Albumin/Globulin Ratio: 1.9 (ref 1.2–2.2)
Albumin: 4.6 g/dL (ref 3.8–4.9)
Alkaline Phosphatase: 95 IU/L (ref 39–117)
BUN/Creatinine Ratio: 15 (ref 9–23)
BUN: 11 mg/dL (ref 6–24)
Bilirubin Total: 0.4 mg/dL (ref 0.0–1.2)
CO2: 23 mmol/L (ref 20–29)
Calcium: 10.1 mg/dL (ref 8.7–10.2)
Chloride: 99 mmol/L (ref 96–106)
Creatinine, Ser: 0.71 mg/dL (ref 0.57–1.00)
GFR calc Af Amer: 109 mL/min/{1.73_m2} (ref >59)
GFR calc non Af Amer: 95 mL/min/{1.73_m2} (ref >59)
Globulin, Total: 2.4 g/dL (ref 1.5–4.5)
Glucose: 96 mg/dL (ref 65–99)
Potassium: 4.4 mmol/L (ref 3.5–5.2)
Sodium: 141 mmol/L (ref 134–144)
Total Protein: 7 g/dL (ref 6.0–8.5)

## 2018-04-07 LAB — HEMOGLOBIN A1C
Est. average glucose Bld gHb Est-mCnc: 120 mg/dL
Hgb A1c MFr Bld: 5.8 % — ABNORMAL HIGH (ref 4.8–5.6)

## 2018-04-07 LAB — LIPID PANEL W/O CHOL/HDL RATIO
Cholesterol, Total: 187 mg/dL (ref 100–199)
HDL: 44 mg/dL (ref >39)
LDL Calculated: 107 mg/dL — ABNORMAL HIGH (ref 0–99)
Triglycerides: 179 mg/dL — ABNORMAL HIGH (ref 0–149)
VLDL Cholesterol Cal: 36 mg/dL (ref 5–40)

## 2018-04-07 LAB — VITAMIN D 25 HYDROXY (VIT D DEFICIENCY, FRACTURES): Vit D, 25-Hydroxy: 20.3 ng/mL — ABNORMAL LOW (ref 30.0–100.0)

## 2018-04-07 LAB — MAGNESIUM: Magnesium: 2.2 mg/dL (ref 1.6–2.3)

## 2018-05-27 DIAGNOSIS — G4733 Obstructive sleep apnea (adult) (pediatric): Secondary | ICD-10-CM | POA: Diagnosis not present

## 2018-06-10 ENCOUNTER — Ambulatory Visit: Payer: Self-pay

## 2018-06-10 DIAGNOSIS — Z23 Encounter for immunization: Secondary | ICD-10-CM | POA: Diagnosis not present

## 2018-06-10 DIAGNOSIS — S61012A Laceration without foreign body of left thumb without damage to nail, initial encounter: Secondary | ICD-10-CM | POA: Diagnosis not present

## 2018-06-10 NOTE — Telephone Encounter (Signed)
Thank you. Noted.

## 2018-06-10 NOTE — Telephone Encounter (Signed)
Pt called to report a puncture wound from a metal fence that slammed into her hand. Pt stated there is a hole in the skin between her thumb and index finger. Pt stated the fence was rusty. Last tetanus in 2014. Bleeding is under control. Advised pt to go to [facility]. Pt plans to go to [facility] for evaluation.           Reason for Disposition . Puncture wound from a sharp object that was very dirty . [1] Puncture wound of finger AND [2] entire finger swollen  Answer Assessment - Initial Assessment Questions 1. LOCATION: "Where is the puncture located?"      Webbing thumb and index finger 2. OBJECT: "What was the object that punctured the skin?"      Hand got slammed on a metal fence that was rusty 3. DEPTH: "How deep do you think the puncture goes?"      Went deep left a round hole 4. ONSET: "When did the injury occur?" (Minutes or hours)     10:20 this am  5. PAIN: "Is it painful?" If so, ask: "How bad is the pain?"  (Scale 1-10; or mild, moderate, severe)     moderate 6. TETANUS: "When was the last tetanus booster?"     2014 7. PREGNANCY: "Is there any chance you are pregnant?" "When was your last menstrual period?"     n/a  Protocols used: PUNCTURE WOUND-A-AH

## 2018-06-13 ENCOUNTER — Other Ambulatory Visit: Payer: Self-pay | Admitting: Unknown Physician Specialty

## 2018-06-17 DIAGNOSIS — S61012D Laceration without foreign body of left thumb without damage to nail, subsequent encounter: Secondary | ICD-10-CM | POA: Diagnosis not present

## 2018-06-17 DIAGNOSIS — Z4802 Encounter for removal of sutures: Secondary | ICD-10-CM | POA: Diagnosis not present

## 2018-07-20 ENCOUNTER — Ambulatory Visit: Payer: Self-pay

## 2018-07-20 NOTE — Telephone Encounter (Signed)
Patient called and says she had a tick bite between the 2nd and 3rd toe on the right foot that she found last night after noticing itching. She says the tick was big and brown, she removed the entire tick and it's in a bag in the freezer. She says the area under the 3rd toe is red, has a pea-sized knot, it itches and a bearable painful throb. She denies any other symptoms. I advised that when the office opens tomorrow, someone will call to schedule an appointment, care advice given, she verbalized understanding. CB is her cell number [phone].  Reason for Disposition . [1] Red or very tender (to touch) area AND [2] started over 24 hours after the bite  Answer Assessment - Initial Assessment Questions 1. TYPE of TICK: "Is it a wood tick or a deer tick?" If unsure, ask: "What size was the tick?" "Did it look more like a watermelon seed or a poppy seed?"      Big, brown tick 2. LOCATION: "Where is the tick bite located?"      Between 2nd and 3rd toes right foot 3. ONSET: "How long do you think the tick was attached before you removed it?" (Hours or days)      I don't know 4. TETANUS: "When was the last tetanus booster?"      3 weeks ago 5. PREGNANCY: "Is there any chance you are pregnant?" "When was your last menstrual period?"     No  Protocols used: TICK BITE-A-AH

## 2018-07-21 ENCOUNTER — Other Ambulatory Visit: Payer: Self-pay

## 2018-07-21 ENCOUNTER — Ambulatory Visit: Payer: BLUE CROSS/BLUE SHIELD | Admitting: Nurse Practitioner

## 2018-07-21 ENCOUNTER — Encounter: Payer: Self-pay | Admitting: Nurse Practitioner

## 2018-07-21 DIAGNOSIS — L089 Local infection of the skin and subcutaneous tissue, unspecified: Secondary | ICD-10-CM | POA: Diagnosis not present

## 2018-07-21 DIAGNOSIS — W57XXXA Bitten or stung by nonvenomous insect and other nonvenomous arthropods, initial encounter: Secondary | ICD-10-CM

## 2018-07-21 DIAGNOSIS — S90464A Insect bite (nonvenomous), right lesser toe(s), initial encounter: Secondary | ICD-10-CM

## 2018-07-21 MED ORDER — DOXYCYCLINE HYCLATE 100 MG PO TABS: 100.0000 mg | ORAL_TABLET | Freq: Two times a day (BID) | ORAL | 0 refills | Status: DC

## 2018-07-21 MED ORDER — MUPIROCIN 2 % EX OINT: 1.0000 "application " | TOPICAL_OINTMENT | Freq: Two times a day (BID) | CUTANEOUS | 0 refills | Status: DC

## 2018-07-21 NOTE — Patient Instructions (Signed)

## 2018-07-21 NOTE — Assessment & Plan Note (Signed)
Acute, removed tick 48 hours ago.  Will initiate Doxycycline for tick bite with edema and erythema to area.  Doxycycline and Mupirocin scripts sent.  Recommend monitoring area daily, along with daily temperatures.  May apply ice to area for comfort and to help with edema.  Return in one week, or sooner if needed, for follow-up.

## 2018-07-21 NOTE — Progress Notes (Signed)
BP 134/87   Pulse 76   Temp 98.3 F (36.8 C) (Oral)   Ht 5\' 5"  (1.651 m)   Wt 205 lb (93 kg)   LMP  (LMP Unknown)   SpO2 99%   BMI 34.11 kg/m    Subjective:    Patient ID: Carrie Arroyo, female    DOB: Nov 29, 1960, 58 y.o.   MRN: [id number]  HPI: Carrie Bostonlice E Rupard is a 58 y.o. female  Chief Complaint  Patient presents with  . Tick Removal    2 days ago. pt states found the tick between toes. right foot   Tick Bite States she found tick on her 2 days ago, "was not all fat like he had been there forever".  Reports right foot between toes and there is redness, swelling, and pain.  She kept tick and brought to appointment today, appears to be American Dog Tick. Duration: 48 hours Location: between 2nd and 3rd toe right Onset: Itching: yes Status: stable Treatments attempted: none Fever: no Chills: no headache: no Muscle pain: no Rash: yes  Relevant past medical, surgical, family and social history reviewed and updated as indicated. Interim medical history since our last visit reviewed. Allergies and medications reviewed and updated.  Review of Systems  Constitutional: Negative for activity change, appetite change, chills, diaphoresis, fatigue and fever.  Respiratory: Negative for cough, chest tightness and shortness of breath.   Cardiovascular: Negative for chest pain, palpitations and leg swelling.  Gastrointestinal: Negative for abdominal distention, abdominal pain, constipation, diarrhea, nausea and vomiting.  Musculoskeletal: Negative for myalgias.  Skin: Positive for rash.  Neurological: Negative for dizziness, syncope, weakness, light-headedness, numbness and headaches.  Psychiatric/Behavioral: Negative.     Per HPI unless specifically indicated above     Objective:    BP 134/87   Pulse 76   Temp 98.3 F (36.8 C) (Oral)   Ht 5\' 5"  (1.651 m)   Wt 205 lb (93 kg)   LMP  (LMP Unknown)   SpO2 99%   BMI 34.11 kg/m   Wt Readings from Last 3 Encounters:   07/21/18 205 lb (93 kg)  04/06/18 204 lb 6 oz (92.7 kg)  09/15/17 204 lb 8 oz (92.8 kg)    Physical Exam Vitals signs and nursing note reviewed.  Constitutional:      General: She is awake. She is not in acute distress.    Appearance: She is well-developed. She is obese. She is not ill-appearing.  HENT:     Head: Normocephalic.     Right Ear: Hearing normal.     Left Ear: Hearing normal.     Nose: Nose normal.  Eyes:     General: Lids are normal.        Right eye: No discharge.        Left eye: No discharge.     Conjunctiva/sclera: Conjunctivae normal.     Pupils: Pupils are equal, round, and reactive to light.  Neck:     Musculoskeletal: Normal range of motion.  Cardiovascular:     Rate and Rhythm: Normal rate and regular rhythm.     Heart sounds: Normal heart sounds. No murmur. No gallop.   Pulmonary:     Effort: Pulmonary effort is normal. No accessory muscle usage or respiratory distress.     Breath sounds: Normal breath sounds.  Abdominal:     General: Bowel sounds are normal.     Palpations: Abdomen is soft. There is no hepatomegaly or splenomegaly.  Musculoskeletal:  Right lower leg: No edema.     Left lower leg: No edema.  Skin:    General: Skin is warm and dry.     Findings: Rash present.     Comments: Small rash present between right third and fourth toes, mild erythema and edema.  Edema present to pedal medial aspect of fourth toe with small pinpoint crusting to middle aspect.  No warmth or tenderness.    Neurological:     Mental Status: She is alert and oriented to person, place, and time.  Psychiatric:        Attention and Perception: Attention normal.        Mood and Affect: Mood normal.        Behavior: Behavior normal. Behavior is cooperative.        Thought Content: Thought content normal.        Judgment: Judgment normal.     Results for orders placed or performed in visit on 04/06/18  Comprehensive metabolic panel  Result Value Ref Range    Glucose 96 65 - 99 mg/dL   BUN 11 6 - 24 mg/dL   Creatinine, Ser 0.94 0.57 - 1.00 mg/dL   GFR calc non Af Amer 95 >59 mL/min/1.73   GFR calc Af Amer 109 >59 mL/min/1.73   BUN/Creatinine Ratio 15 9 - 23   Sodium 141 134 - 144 mmol/L   Potassium 4.4 3.5 - 5.2 mmol/L   Chloride 99 96 - 106 mmol/L   CO2 23 20 - 29 mmol/L   Calcium 10.1 8.7 - 10.2 mg/dL   Total Protein 7.0 6.0 - 8.5 g/dL   Albumin 4.6 3.8 - 4.9 g/dL   Globulin, Total 2.4 1.5 - 4.5 g/dL   Albumin/Globulin Ratio 1.9 1.2 - 2.2   Bilirubin Total 0.4 0.0 - 1.2 mg/dL   Alkaline Phosphatase 95 39 - 117 IU/L   AST 29 0 - 40 IU/L   ALT 37 (H) 0 - 32 IU/L  Lipid Panel w/o Chol/HDL Ratio  Result Value Ref Range   Cholesterol, Total 187 100 - 199 mg/dL   Triglycerides 076 (H) 0 - 149 mg/dL   HDL 44 >80 mg/dL   VLDL Cholesterol Cal 36 5 - 40 mg/dL   LDL Calculated 881 (H) 0 - 99 mg/dL  VITAMIN D 25 Hydroxy (Vit-D Deficiency, Fractures)  Result Value Ref Range   Vit D, 25-Hydroxy 20.3 (L) 30.0 - 100.0 ng/mL  Magnesium  Result Value Ref Range   Magnesium 2.2 1.6 - 2.3 mg/dL  HgB J0R  Result Value Ref Range   Hgb A1c MFr Bld 5.8 (H) 4.8 - 5.6 %   Est. average glucose Bld gHb Est-mCnc 120 mg/dL      Assessment & Plan:   Problem List Items Addressed This Visit      Musculoskeletal and Integument   Tick bite of toe of right foot with infection    Acute, removed tick 48 hours ago.  Will initiate Doxycycline for tick bite with edema and erythema to area.  Doxycycline and Mupirocin scripts sent.  Recommend monitoring area daily, along with daily temperatures.  May apply ice to area for comfort and to help with edema.  Return in one week, or sooner if needed, for follow-up.      Relevant Medications   mupirocin ointment (BACTROBAN) 2 %       Follow up plan: Return in about 1 week (around 07/28/2018) for Tick bite.

## 2018-07-28 ENCOUNTER — Other Ambulatory Visit: Payer: Self-pay

## 2018-07-28 ENCOUNTER — Ambulatory Visit: Payer: BLUE CROSS/BLUE SHIELD | Admitting: Nurse Practitioner

## 2018-07-28 ENCOUNTER — Encounter: Payer: Self-pay | Admitting: Nurse Practitioner

## 2018-07-28 DIAGNOSIS — W57XXXD Bitten or stung by nonvenomous insect and other nonvenomous arthropods, subsequent encounter: Secondary | ICD-10-CM | POA: Diagnosis not present

## 2018-07-28 DIAGNOSIS — S90464D Insect bite (nonvenomous), right lesser toe(s), subsequent encounter: Secondary | ICD-10-CM | POA: Diagnosis not present

## 2018-07-28 DIAGNOSIS — L089 Local infection of the skin and subcutaneous tissue, unspecified: Secondary | ICD-10-CM | POA: Diagnosis not present

## 2018-07-28 MED ORDER — DOXYCYCLINE HYCLATE 100 MG PO TABS: 100.0000 mg | ORAL_TABLET | Freq: Two times a day (BID) | ORAL | 0 refills | Status: AC

## 2018-07-28 NOTE — Progress Notes (Signed)
BP 128/84   Pulse 96   Temp 98 F (36.7 C) (Oral)   Ht 5\' 5"  (1.651 m)   Wt 205 lb (93 kg)   LMP  (LMP Unknown)   SpO2 98%   BMI 34.11 kg/m    Subjective:    Patient ID: Carrie Arroyo, female    DOB: 1961/02/21, 58 y.o.   MRN: [id number]  HPI: Carrie Arroyo is a 58 y.o. female  Chief Complaint  Patient presents with  . Insect Bite   Tick Bite Treated for tick bite 07/21/2018.  Right foot between 2nd and 3rd toe.  American Dog Tick. Treated with Doxycycline and Mupirocin to area.  Overall she reports improvement to area, continues to have "a little bump there".  States discomfort has improved.   Itching: no Status: better Treatments attempted: Doxycycline and Mupirocin Fever: no Chills: no headache: no Muscle pain: no Rash: no  Relevant past medical, surgical, family and social history reviewed and updated as indicated. Interim medical history since our last visit reviewed. Allergies and medications reviewed and updated.  Review of Systems  Constitutional: Negative for activity change, appetite change, diaphoresis, fatigue and fever.  Respiratory: Negative for cough, chest tightness and shortness of breath.   Cardiovascular: Negative for chest pain, palpitations and leg swelling.  Gastrointestinal: Negative for abdominal distention, abdominal pain, constipation, diarrhea, nausea and vomiting.  Skin: Positive for wound.  Neurological: Negative for dizziness, syncope, weakness, light-headedness, numbness and headaches.  Psychiatric/Behavioral: Negative.     Per HPI unless specifically indicated above     Objective:    BP 128/84   Pulse 96   Temp 98 F (36.7 C) (Oral)   Ht 5\' 5"  (1.651 m)   Wt 205 lb (93 kg)   LMP  (LMP Unknown)   SpO2 98%   BMI 34.11 kg/m   Wt Readings from Last 3 Encounters:  07/28/18 205 lb (93 kg)  07/21/18 205 lb (93 kg)  04/06/18 204 lb 6 oz (92.7 kg)    Physical Exam Vitals signs and nursing note reviewed.  Constitutional:     General: She is awake. She is not in acute distress.    Appearance: She is well-developed. She is obese. She is not ill-appearing.  HENT:     Head: Normocephalic.     Right Ear: Hearing normal.     Left Ear: Hearing normal.     Nose: Nose normal.     Mouth/Throat:     Mouth: Mucous membranes are moist.  Eyes:     General: Lids are normal.        Right eye: No discharge.        Left eye: No discharge.     Conjunctiva/sclera: Conjunctivae normal.     Pupils: Pupils are equal, round, and reactive to light.  Neck:     Musculoskeletal: Normal range of motion and neck supple.     Thyroid: No thyromegaly.     Vascular: No carotid bruit or JVD.  Cardiovascular:     Rate and Rhythm: Normal rate and regular rhythm.     Heart sounds: Normal heart sounds. No murmur. No gallop.   Pulmonary:     Effort: Pulmonary effort is normal. No accessory muscle usage or respiratory distress.     Breath sounds: Normal breath sounds.  Abdominal:     General: Bowel sounds are normal.     Palpations: Abdomen is soft. There is no hepatomegaly or splenomegaly.  Musculoskeletal:  Right lower leg: No edema.     Left lower leg: No edema.  Lymphadenopathy:     Cervical: No cervical adenopathy.  Skin:    General: Skin is warm and dry.     Findings: Wound present.     Comments: Between right third and fourth toes, minimal erythema and edema.  Edema present to pedal medial aspect of fourth toe with crusting to middle aspect and no drainage.  No warmth or tenderness to area.  Overall, decreased in size and erythema on exam today.    Neurological:     Mental Status: She is alert and oriented to person, place, and time.  Psychiatric:        Attention and Perception: Attention normal.        Mood and Affect: Mood normal.        Behavior: Behavior normal. Behavior is cooperative.        Thought Content: Thought content normal.        Judgment: Judgment normal.     Results for orders placed or performed in  visit on 04/06/18  Comprehensive metabolic panel  Result Value Ref Range   Glucose 96 65 - 99 mg/dL   BUN 11 6 - 24 mg/dL   Creatinine, Ser 9.600.71 0.57 - 1.00 mg/dL   GFR calc non Af Amer 95 >59 mL/min/1.73   GFR calc Af Amer 109 >59 mL/min/1.73   BUN/Creatinine Ratio 15 9 - 23   Sodium 141 134 - 144 mmol/L   Potassium 4.4 3.5 - 5.2 mmol/L   Chloride 99 96 - 106 mmol/L   CO2 23 20 - 29 mmol/L   Calcium 10.1 8.7 - 10.2 mg/dL   Total Protein 7.0 6.0 - 8.5 g/dL   Albumin 4.6 3.8 - 4.9 g/dL   Globulin, Total 2.4 1.5 - 4.5 g/dL   Albumin/Globulin Ratio 1.9 1.2 - 2.2   Bilirubin Total 0.4 0.0 - 1.2 mg/dL   Alkaline Phosphatase 95 39 - 117 IU/L   AST 29 0 - 40 IU/L   ALT 37 (H) 0 - 32 IU/L  Lipid Panel w/o Chol/HDL Ratio  Result Value Ref Range   Cholesterol, Total 187 100 - 199 mg/dL   Triglycerides 454179 (H) 0 - 149 mg/dL   HDL 44 >09>39 mg/dL   VLDL Cholesterol Cal 36 5 - 40 mg/dL   LDL Calculated 811107 (H) 0 - 99 mg/dL  VITAMIN D 25 Hydroxy (Vit-D Deficiency, Fractures)  Result Value Ref Range   Vit D, 25-Hydroxy 20.3 (L) 30.0 - 100.0 ng/mL  Magnesium  Result Value Ref Range   Magnesium 2.2 1.6 - 2.3 mg/dL  HgB B1YA1c  Result Value Ref Range   Hgb A1c MFr Bld 5.8 (H) 4.8 - 5.6 %   Est. average glucose Bld gHb Est-mCnc 120 mg/dL      Assessment & Plan:   Problem List Items Addressed This Visit      Musculoskeletal and Integument   Tick bite of toe of right foot with infection    Improving.  Will extend full Doxycycline treatment to 14 days total, refill sent.  Continue Mupirocin and monitoring temperature at home.  May take Tylenol as needed for discomfort.  Return for worsening or continued symptoms.          Follow up plan: Return if symptoms worsen or fail to improve.

## 2018-07-28 NOTE — Assessment & Plan Note (Signed)
Improving.  Will extend full Doxycycline treatment to 14 days total, refill sent.  Continue Mupirocin and monitoring temperature at home.  May take Tylenol as needed for discomfort.  Return for worsening or continued symptoms.

## 2018-07-28 NOTE — Patient Instructions (Signed)

## 2018-08-23 ENCOUNTER — Other Ambulatory Visit: Payer: Self-pay | Admitting: Nurse Practitioner

## 2018-09-18 ENCOUNTER — Other Ambulatory Visit: Payer: Self-pay | Admitting: Nurse Practitioner

## 2018-09-19 NOTE — Telephone Encounter (Signed)
Requested Prescriptions  Pending Prescriptions Disp Refills  . benazepril (LOTENSIN) 20 MG tablet [Pharmacy Med Name: BENAZEPRIL HCL 20 MG TABLET] 90 tablet 0    Sig: TAKE 1 TABLET BY MOUTH EVERY DAY     Cardiovascular:  ACE Inhibitors Passed - 09/18/2018 12:56 PM      Passed - Cr in normal range and within 180 days    Creatinine, Ser  Date Value Ref Range Status  04/06/2018 0.71 0.57 - 1.00 mg/dL Final         Passed - K in normal range and within 180 days    Potassium  Date Value Ref Range Status  04/06/2018 4.4 3.5 - 5.2 mmol/L Final         Passed - Patient is not pregnant      Passed - Last BP in normal range    BP Readings from Last 1 Encounters:  07/28/18 128/84         Passed - Valid encounter within last 6 months    Recent Outpatient Visits          1 month ago Tick bite of toe of right foot with infection, subsequent encounter   [facility] [place], Jolene T, NP   2 months ago Tick bite of toe of right foot with infection, initial encounter   [place], Barbaraann Faster, NP   5 months ago Essential hypertension   [place], Henrine Screws T, NP   1 year ago Annual physical exam   [place] Kathrine Haddock, NP   1 year ago Essential hypertension   [facility] Kathrine Haddock, NP      Future Appointments            In 2 weeks Cann[place], Barbaraann Faster, NP [facility]

## 2018-10-05 ENCOUNTER — Ambulatory Visit: Payer: BLUE CROSS/BLUE SHIELD | Admitting: Nurse Practitioner

## 2018-11-11 DIAGNOSIS — Z1231 Encounter for screening mammogram for malignant neoplasm of breast: Secondary | ICD-10-CM | POA: Diagnosis not present

## 2018-11-11 LAB — HM MAMMOGRAPHY

## 2018-11-16 ENCOUNTER — Other Ambulatory Visit: Payer: Self-pay

## 2018-11-16 ENCOUNTER — Encounter: Payer: Self-pay | Admitting: Nurse Practitioner

## 2018-11-16 ENCOUNTER — Ambulatory Visit (INDEPENDENT_AMBULATORY_CARE_PROVIDER_SITE_OTHER): Payer: BC Managed Care – PPO | Admitting: Nurse Practitioner

## 2018-11-16 VITALS — BP 118/80 | HR 77 | Temp 98.1°F | Wt 193.8 lb

## 2018-11-16 DIAGNOSIS — R7989 Other specified abnormal findings of blood chemistry: Secondary | ICD-10-CM | POA: Insufficient documentation

## 2018-11-16 DIAGNOSIS — E6609 Other obesity due to excess calories: Secondary | ICD-10-CM | POA: Diagnosis not present

## 2018-11-16 DIAGNOSIS — Z6832 Body mass index (BMI) 32.0-32.9, adult: Secondary | ICD-10-CM

## 2018-11-16 DIAGNOSIS — R7303 Prediabetes: Secondary | ICD-10-CM | POA: Diagnosis not present

## 2018-11-16 DIAGNOSIS — H6981 Other specified disorders of Eustachian tube, right ear: Secondary | ICD-10-CM | POA: Insufficient documentation

## 2018-11-16 DIAGNOSIS — E559 Vitamin D deficiency, unspecified: Secondary | ICD-10-CM | POA: Diagnosis not present

## 2018-11-16 DIAGNOSIS — E7800 Pure hypercholesterolemia, unspecified: Secondary | ICD-10-CM

## 2018-11-16 DIAGNOSIS — I10 Essential (primary) hypertension: Secondary | ICD-10-CM

## 2018-11-16 LAB — BAYER DCA HB A1C WAIVED: HB A1C (BAYER DCA - WAIVED): 5.8 % (ref <7.0)

## 2018-11-16 MED ORDER — PREDNISONE 10 MG PO TABS: 30.0000 mg | ORAL_TABLET | Freq: Every day | ORAL | 0 refills | Status: AC

## 2018-11-16 NOTE — Patient Instructions (Signed)

## 2018-11-16 NOTE — Assessment & Plan Note (Signed)
Recommend continued focus on health diet choices and regular physical activity (30 minutes 5 days a week).  Praised for recent 12 pound weight loss.

## 2018-11-16 NOTE — Assessment & Plan Note (Signed)
Script for Prednisone sent for 5 days.  Recommend to return to office if worsening or continued discomfort.

## 2018-11-16 NOTE — Assessment & Plan Note (Signed)
Chronic, ongoing.  Continue current medication regimen.  Lipid panel today and adjust statin as needed. 

## 2018-11-16 NOTE — Progress Notes (Signed)
BP 118/80 (BP Location: Left Arm)   Pulse 77   Temp 98.1 F (36.7 C) (Oral)   Wt 193 lb 12.8 oz (87.9 kg)   LMP  (LMP Unknown)   SpO2 99%   BMI 32.25 kg/m    Subjective:    Patient ID: Carrie Arroyo, female    DOB: 1960/04/16, 58 y.o.   MRN: [id number]  HPI: Carrie Bostonlice E Binns is a 58 y.o. female  Chief Complaint  Patient presents with  . Hyperlipidemia  . Hypertension   HYPERTENSION / HYPERLIPIDEMIA Continues on Benazepril, Simvastatin, ASA.  Has been on diet, cutting back on bread and sugar. Satisfied with current treatment? yes Duration of hypertension: chronic BP monitoring frequency: daily BP range: 119/80 range often at home BP medication side effects: no Duration of hyperlipidemia: chronic Cholesterol medication side effects: no Cholesterol supplements: none Medication compliance: good compliance Aspirin: yes Recent stressors: no Recurrent headaches: no Visual changes: no Palpitations: no Dyspnea: no Chest pain: no Lower extremity edema: no Dizzy/lightheaded: no   PREDIABETES: Last A1C in February 5.8%.   Hypoglycemic episodes:no Polydipsia/polyuria: no Visual disturbance: no Chest pain: no Paresthesias: no   HISTORY OF LOW TSH LEVEL: One year ago TSH 0.350, with repeat 1.070 ten months ago. Fatigue: no Cold intolerance: no Heat intolerance: no Weight gain: no Weight loss: no Constipation: no Diarrhea/loose stools: no Palpitations: no Lower extremity edema: no Anxiety/depressed mood: no   VITAMIN D DEFICIENCY: No current supplement, last level 20.3 in February 2020.  Denies any recent falls or fractures.  No increased fatigue or muscle pain.  RIGHT EAR POPPING: Has been popping for a few weeks.  Has allergies at baseline, takes Claritin and Flonase.  Denies pain, pressure, or drainage form ear.  Has had some sinus drainage.  Relevant past medical, surgical, family and social history reviewed and updated as indicated. Interim medical history  since our last visit reviewed. Allergies and medications reviewed and updated.  Review of Systems  Constitutional: Negative for activity change, appetite change, diaphoresis, fatigue and fever.  Respiratory: Negative for cough, chest tightness and shortness of breath.   Cardiovascular: Negative for chest pain, palpitations and leg swelling.  Gastrointestinal: Negative for abdominal distention, abdominal pain, constipation, diarrhea, nausea and vomiting.  Endocrine: Negative for cold intolerance, heat intolerance, polydipsia, polyphagia and polyuria.  Neurological: Negative for dizziness, syncope, weakness, light-headedness, numbness and headaches.  Psychiatric/Behavioral: Negative.     Per HPI unless specifically indicated above     Objective:    BP 118/80 (BP Location: Left Arm)   Pulse 77   Temp 98.1 F (36.7 C) (Oral)   Wt 193 lb 12.8 oz (87.9 kg)   LMP  (LMP Unknown)   SpO2 99%   BMI 32.25 kg/m   Wt Readings from Last 3 Encounters:  11/16/18 193 lb 12.8 oz (87.9 kg)  07/28/18 205 lb (93 kg)  07/21/18 205 lb (93 kg)    Physical Exam Vitals signs and nursing note reviewed.  Constitutional:      General: She is awake. She is not in acute distress.    Appearance: She is well-developed and overweight. She is not ill-appearing.  HENT:     Head: Normocephalic.     Right Ear: Hearing, ear canal and external ear normal. No drainage or tenderness. A middle ear effusion is present. Tympanic membrane is not injected.     Left Ear: Hearing, tympanic membrane, ear canal and external ear normal.  Eyes:     General: Lids  are normal.        Right eye: No discharge.        Left eye: No discharge.     Conjunctiva/sclera: Conjunctivae normal.     Pupils: Pupils are equal, round, and reactive to light.  Neck:     Musculoskeletal: Normal range of motion and neck supple.     Thyroid: No thyromegaly.     Vascular: No carotid bruit.  Cardiovascular:     Rate and Rhythm: Normal rate and  regular rhythm.     Heart sounds: Normal heart sounds. No murmur. No gallop.   Pulmonary:     Effort: Pulmonary effort is normal.     Breath sounds: Normal breath sounds.  Abdominal:     General: Bowel sounds are normal.     Palpations: Abdomen is soft. There is no hepatomegaly or splenomegaly.  Musculoskeletal:     Right lower leg: No edema.     Left lower leg: No edema.  Skin:    General: Skin is warm and dry.  Neurological:     Mental Status: She is alert and oriented to person, place, and time.  Psychiatric:        Attention and Perception: Attention normal.        Mood and Affect: Mood normal.        Behavior: Behavior normal. Behavior is cooperative.        Thought Content: Thought content normal.        Judgment: Judgment normal.     Results for orders placed or performed in visit on 11/12/18  HM MAMMOGRAPHY  Result Value Ref Range   HM Mammogram 0-4 Bi-Rad 0-4 Bi-Rad, Self Reported Normal      Assessment & Plan:   Problem List Items Addressed This Visit      Cardiovascular and Mediastinum   Hypertension - Primary    Chronic, stable with BP at goal at home and on repeat today.  Continue current medication regimen.  CMP today.        Nervous and Auditory   Acute dysfunction of right eustachian tube    Script for Prednisone sent for 5 days.  Recommend to return to office if worsening or continued discomfort.        Other   Vitamin D deficiency    Recheck Vit D level today and recommend daily supplement, Vit D3 1000 units.      Relevant Orders   VITAMIN D 25 Hydroxy (Vit-D Deficiency, Fractures)   Prediabetes    A1C today 5.8%, staying at baseline.  Continue focus on diet and weight loss.  Praised for recent weight loss of 12 pounds.      Relevant Orders   Bayer DCA Hb A1c Waived   Hyperlipidemia    Chronic, ongoing.  Continue current medication regimen.  Lipid panel today and adjust statin as needed.      Relevant Orders   Lipid Panel w/o Chol/HDL  Ratio   Comprehensive metabolic panel   Obesity    Recommend continued focus on health diet choices and regular physical activity (30 minutes 5 days a week).  Praised for recent 12 pound weight loss.      Low TSH level    Recent level within normal range months back, will recheck today.      Relevant Orders   Thyroid Panel With TSH       Follow up plan: Return in about 6 months (around 05/16/2019) for HTN/HLD, Prediabetes.

## 2018-11-16 NOTE — Assessment & Plan Note (Signed)
A1C today 5.8%, staying at baseline.  Continue focus on diet and weight loss.  Praised for recent weight loss of 12 pounds.

## 2018-11-16 NOTE — Assessment & Plan Note (Signed)
Recheck Vit D level today and recommend daily supplement, Vit D3 1000 units.

## 2018-11-16 NOTE — Assessment & Plan Note (Signed)
Chronic, stable with BP at goal at home and on repeat today.  Continue current medication regimen.  CMP today.

## 2018-11-16 NOTE — Assessment & Plan Note (Signed)
Recent level within normal range months back, will recheck today.

## 2018-11-17 LAB — LIPID PANEL W/O CHOL/HDL RATIO
Cholesterol, Total: 177 mg/dL (ref 100–199)
HDL: 43 mg/dL (ref >39)
LDL Chol Calc (NIH): 111 mg/dL — ABNORMAL HIGH (ref 0–99)
Triglycerides: 127 mg/dL (ref 0–149)
VLDL Cholesterol Cal: 23 mg/dL (ref 5–40)

## 2018-11-17 LAB — COMPREHENSIVE METABOLIC PANEL
ALT: 25 IU/L (ref 0–32)
AST: 25 IU/L (ref 0–40)
Albumin/Globulin Ratio: 2.1 (ref 1.2–2.2)
Albumin: 4.7 g/dL (ref 3.8–4.9)
Alkaline Phosphatase: 104 IU/L (ref 39–117)
BUN/Creatinine Ratio: 19 (ref 9–23)
BUN: 13 mg/dL (ref 6–24)
Bilirubin Total: 0.4 mg/dL (ref 0.0–1.2)
CO2: 22 mmol/L (ref 20–29)
Calcium: 9.6 mg/dL (ref 8.7–10.2)
Chloride: 102 mmol/L (ref 96–106)
Creatinine, Ser: 0.7 mg/dL (ref 0.57–1.00)
GFR calc Af Amer: 111 mL/min/{1.73_m2} (ref >59)
GFR calc non Af Amer: 96 mL/min/{1.73_m2} (ref >59)
Globulin, Total: 2.2 g/dL (ref 1.5–4.5)
Glucose: 99 mg/dL (ref 65–99)
Potassium: 4.2 mmol/L (ref 3.5–5.2)
Sodium: 141 mmol/L (ref 134–144)
Total Protein: 6.9 g/dL (ref 6.0–8.5)

## 2018-11-17 LAB — VITAMIN D 25 HYDROXY (VIT D DEFICIENCY, FRACTURES): Vit D, 25-Hydroxy: 27 ng/mL — ABNORMAL LOW (ref 30.0–100.0)

## 2018-11-17 LAB — THYROID PANEL WITH TSH
Free Thyroxine Index: 1.6 (ref 1.2–4.9)
T3 Uptake Ratio: 25 % (ref 24–39)
T4, Total: 6.2 ug/dL (ref 4.5–12.0)
TSH: 1.06 u[IU]/mL (ref 0.450–4.500)

## 2018-12-11 ENCOUNTER — Other Ambulatory Visit: Payer: Self-pay | Admitting: Nurse Practitioner

## 2018-12-14 DIAGNOSIS — D225 Melanocytic nevi of trunk: Secondary | ICD-10-CM | POA: Diagnosis not present

## 2018-12-14 DIAGNOSIS — L538 Other specified erythematous conditions: Secondary | ICD-10-CM | POA: Diagnosis not present

## 2018-12-14 DIAGNOSIS — L820 Inflamed seborrheic keratosis: Secondary | ICD-10-CM | POA: Diagnosis not present

## 2018-12-14 DIAGNOSIS — L570 Actinic keratosis: Secondary | ICD-10-CM | POA: Diagnosis not present

## 2018-12-14 DIAGNOSIS — Z08 Encounter for follow-up examination after completed treatment for malignant neoplasm: Secondary | ICD-10-CM | POA: Diagnosis not present

## 2018-12-14 DIAGNOSIS — X32XXXA Exposure to sunlight, initial encounter: Secondary | ICD-10-CM | POA: Diagnosis not present

## 2018-12-14 DIAGNOSIS — L732 Hidradenitis suppurativa: Secondary | ICD-10-CM | POA: Diagnosis not present

## 2018-12-14 DIAGNOSIS — L821 Other seborrheic keratosis: Secondary | ICD-10-CM | POA: Diagnosis not present

## 2019-01-03 DIAGNOSIS — D110 Benign neoplasm of parotid gland: Secondary | ICD-10-CM | POA: Diagnosis not present

## 2019-02-15 ENCOUNTER — Other Ambulatory Visit: Payer: Self-pay | Admitting: Nurse Practitioner

## 2019-03-20 ENCOUNTER — Other Ambulatory Visit: Payer: Self-pay | Admitting: Nurse Practitioner

## 2019-05-17 ENCOUNTER — Ambulatory Visit: Payer: Self-pay | Admitting: Nurse Practitioner

## 2019-05-17 ENCOUNTER — Encounter: Payer: BC Managed Care – PPO | Admitting: Nurse Practitioner

## 2019-05-17 VITALS — Ht 65.0 in | Wt 193.0 lb

## 2019-05-17 DIAGNOSIS — G4733 Obstructive sleep apnea (adult) (pediatric): Secondary | ICD-10-CM | POA: Diagnosis not present

## 2019-05-17 NOTE — Progress Notes (Signed)
Entered in error, patient rescheduled

## 2019-05-24 ENCOUNTER — Other Ambulatory Visit: Payer: Self-pay

## 2019-05-24 ENCOUNTER — Encounter: Payer: Self-pay | Admitting: Nurse Practitioner

## 2019-05-24 ENCOUNTER — Ambulatory Visit (INDEPENDENT_AMBULATORY_CARE_PROVIDER_SITE_OTHER): Payer: BC Managed Care – PPO | Admitting: Nurse Practitioner

## 2019-05-24 VITALS — BP 138/87 | HR 82 | Temp 98.7°F | Ht 65.0 in | Wt 194.0 lb

## 2019-05-24 DIAGNOSIS — E6609 Other obesity due to excess calories: Secondary | ICD-10-CM

## 2019-05-24 DIAGNOSIS — R7303 Prediabetes: Secondary | ICD-10-CM

## 2019-05-24 DIAGNOSIS — G4700 Insomnia, unspecified: Secondary | ICD-10-CM | POA: Insufficient documentation

## 2019-05-24 DIAGNOSIS — E559 Vitamin D deficiency, unspecified: Secondary | ICD-10-CM

## 2019-05-24 DIAGNOSIS — K219 Gastro-esophageal reflux disease without esophagitis: Secondary | ICD-10-CM | POA: Diagnosis not present

## 2019-05-24 DIAGNOSIS — E7800 Pure hypercholesterolemia, unspecified: Secondary | ICD-10-CM

## 2019-05-24 DIAGNOSIS — Z6832 Body mass index (BMI) 32.0-32.9, adult: Secondary | ICD-10-CM

## 2019-05-24 DIAGNOSIS — I10 Essential (primary) hypertension: Secondary | ICD-10-CM

## 2019-05-24 DIAGNOSIS — F5104 Psychophysiologic insomnia: Secondary | ICD-10-CM

## 2019-05-24 LAB — BAYER DCA HB A1C WAIVED: HB A1C (BAYER DCA - WAIVED): 5.6 % (ref <7.0)

## 2019-05-24 LAB — MICROALBUMIN, URINE WAIVED
Creatinine, Urine Waived: 10 mg/dL (ref 10–300)
Microalb, Ur Waived: 10 mg/L (ref 0–19)
Microalb/Creat Ratio: <30 mg/g (ref <30)

## 2019-05-24 MED ORDER — TRAZODONE HCL 50 MG PO TABS: 25.0000 mg | ORAL_TABLET | Freq: Every evening | ORAL | 3 refills | Status: DC | PRN

## 2019-05-24 MED ORDER — SIMVASTATIN 20 MG PO TABS: ORAL_TABLET | ORAL | 3 refills | Status: DC

## 2019-05-24 MED ORDER — BENAZEPRIL HCL 20 MG PO TABS: 20.0000 mg | ORAL_TABLET | Freq: Every day | ORAL | 3 refills | Status: DC

## 2019-05-24 NOTE — Assessment & Plan Note (Signed)
Recommend continued focus on healthy diet choices and regular physical activity (30 minutes 5 days a week).  

## 2019-05-24 NOTE — Assessment & Plan Note (Signed)
Ongoing and improved, A1C 5.6% today.  Praised for success with diet and exercise.  Continue this focus and recheck A1C in 6 months to one year.

## 2019-05-24 NOTE — Assessment & Plan Note (Signed)
Chronic, stable. Continue current medication regimen and adjust as needed.  Mag level today. 

## 2019-05-24 NOTE — Assessment & Plan Note (Signed)
New onset with loss of mother.  Will start Trazodone 25 to 50 MG as needed daily at bedtime.  Script sent.  Educated patient on medication and sleep hygiene techniques.  Return to office in 6 weeks for follow-up.

## 2019-05-24 NOTE — Assessment & Plan Note (Signed)
Chronic, stable with BP at goal at home and close to goal on visit today, has had increased stressors recently.  Continue current medication regimen and adjust as needed, refills sent.  BMP today.

## 2019-05-24 NOTE — Progress Notes (Signed)
BP 138/87   Pulse 82   Temp 98.7 F (37.1 C) (Oral)   Ht 5\' 5"  (1.651 m)   Wt 194 lb (88 kg)   LMP  (LMP Unknown)   SpO2 100%   BMI 32.28 kg/m    Subjective:    Patient ID: , female    DOB: 1960/04/15, 59 y.o.   MRN: [id number]  HPI: Carrie Arroyo is a 58 y.o. female  Chief Complaint  Patient presents with  . Hypertension  . Hyperlipidemia  . Prediabetes   HYPERTENSION / HYPERLIPIDEMIA Continues on Benazepril, Simvastatin, ASA.  Has been on diet, cutting back on bread and sugar. Satisfied with current treatment? yes Duration of hypertension: chronic BP monitoring frequency: rarely BP range: 120-130/70-80 BP medication side effects: no Duration of hyperlipidemia: chronic Cholesterol medication side effects: no Cholesterol supplements: none Medication compliance: good compliance Aspirin: yes Recent stressors: no Recurrent headaches: no Visual changes: no Palpitations: no Dyspnea: no Chest pain: no Lower extremity edema: no Dizzy/lightheaded: no   PREDIABETES: Last A1C in September 5.8%.   Hypoglycemic episodes:no Polydipsia/polyuria: no Visual disturbance: no Chest pain: no Paresthesias: no   GERD Continues on Prilosec, generic. GERD control status: stable  Satisfied with current treatment? yes Heartburn frequency: none Medication side effects: no  Medication compliance: stable Previous GERD medications: TUMS Antacid use frequency:  none  Dysphagia: no Odynophagia:  no Hematemesis: no Blood in stool: no EGD: yes   VITAMIN D DEFICIENCY: No current supplement, last level 28 November 2018.  Denies any recent falls or fractures.  No increased fatigue or muscle pain.  INSOMNIA Mother passed away suddenly and traumatically on 04/17/2019, she was not able to make it there in time to see her.  Denies depressed, is grieving.  Has not been sleeping well.   Duration: months Satisfied with sleep quality: no Difficulty falling asleep: yes  Difficulty staying asleep: yes Waking a few hours after sleep onset: yes Early morning awakenings: no Daytime hypersomnolence: no Wakes feeling refreshed: yes Good sleep hygiene: yes Apnea: no Snoring: no Depressed/anxious mood: yes Recent stress: yes Restless legs/nocturnal leg cramps: no Chronic pain/arthritis: no History of sleep study: no Treatments attempted: benadryl    Relevant past medical, surgical, family and social history reviewed and updated as indicated. Interim medical history since our last visit reviewed. Allergies and medications reviewed and updated.  Review of Systems  Constitutional: Negative for activity change, appetite change, diaphoresis, fatigue and fever.  Respiratory: Negative for cough, chest tightness and shortness of breath.   Cardiovascular: Negative for chest pain, palpitations and leg swelling.  Gastrointestinal: Negative for abdominal distention, abdominal pain, constipation, diarrhea, nausea and vomiting.  Endocrine: Negative for cold intolerance, heat intolerance, polydipsia, polyphagia and polyuria.  Neurological: Negative for dizziness, syncope, weakness, light-headedness, numbness and headaches.  Psychiatric/Behavioral: Negative.     Per HPI unless specifically indicated above     Objective:    BP 138/87   Pulse 82   Temp 98.7 F (37.1 C) (Oral)   Ht 5\' 5"  (1.651 m)   Wt 194 lb (88 kg)   LMP  (LMP Unknown)   SpO2 100%   BMI 32.28 kg/m   Wt Readings from Last 3 Encounters:  05/24/19 194 lb (88 kg)  05/17/19 193 lb (87.5 kg)  11/16/18 193 lb 12.8 oz (87.9 kg)    Physical Exam Vitals and nursing note reviewed.  Constitutional:      General: She is awake. She is not in acute distress.  Appearance: She is well-developed and overweight. She is not ill-appearing.  HENT:     Head: Normocephalic.     Right Ear: Hearing normal.     Left Ear: Hearing normal.  Eyes:     General: Lids are normal.        Right eye: No discharge.         Left eye: No discharge.     Conjunctiva/sclera: Conjunctivae normal.     Pupils: Pupils are equal, round, and reactive to light.  Neck:     Thyroid: No thyromegaly.     Vascular: No carotid bruit.  Cardiovascular:     Rate and Rhythm: Normal rate and regular rhythm.     Heart sounds: Normal heart sounds. No murmur. No gallop.   Pulmonary:     Effort: Pulmonary effort is normal.     Breath sounds: Normal breath sounds.  Abdominal:     General: Bowel sounds are normal.     Palpations: Abdomen is soft. There is no hepatomegaly or splenomegaly.  Musculoskeletal:     Cervical back: Normal range of motion and neck supple.     Right lower leg: No edema.     Left lower leg: No edema.  Skin:    General: Skin is warm and dry.  Neurological:     Mental Status: She is alert and oriented to person, place, and time.  Psychiatric:        Attention and Perception: Attention normal.        Mood and Affect: Mood normal.        Behavior: Behavior normal. Behavior is cooperative.        Thought Content: Thought content normal.        Judgment: Judgment normal.     Results for orders placed or performed in visit on 11/16/18  Bayer DCA Hb A1c Waived  Result Value Ref Range   HB A1C (BAYER DCA - WAIVED) 5.8 <7.0 %  VITAMIN D 25 Hydroxy (Vit-D Deficiency, Fractures)  Result Value Ref Range   Vit D, 25-Hydroxy 27.0 (L) 30.0 - 100.0 ng/mL  Lipid Panel w/o Chol/HDL Ratio  Result Value Ref Range   Cholesterol, Total 177 100 - 199 mg/dL   Triglycerides 127 0 - 149 mg/dL   HDL 43 >39 mg/dL   VLDL Cholesterol Cal 23 5 - 40 mg/dL   LDL Chol Calc (NIH) 111 (H) 0 - 99 mg/dL  Comprehensive metabolic panel  Result Value Ref Range   Glucose 99 65 - 99 mg/dL   BUN 13 6 - 24 mg/dL   Creatinine, Ser 0.70 0.57 - 1.00 mg/dL   GFR calc non Af Amer 96 >59 mL/min/1.73   GFR calc Af Amer 111 >59 mL/min/1.73   BUN/Creatinine Ratio 19 9 - 23   Sodium 141 134 - 144 mmol/L   Potassium 4.2 3.5 - 5.2  mmol/L   Chloride 102 96 - 106 mmol/L   CO2 22 20 - 29 mmol/L   Calcium 9.6 8.7 - 10.2 mg/dL   Total Protein 6.9 6.0 - 8.5 g/dL   Albumin 4.7 3.8 - 4.9 g/dL   Globulin, Total 2.2 1.5 - 4.5 g/dL   Albumin/Globulin Ratio 2.1 1.2 - 2.2   Bilirubin Total 0.4 0.0 - 1.2 mg/dL   Alkaline Phosphatase 104 39 - 117 IU/L   AST 25 0 - 40 IU/L   ALT 25 0 - 32 IU/L  Thyroid Panel With TSH  Result Value Ref Range   TSH 1.060  0.450 - 4.500 uIU/mL   T4, Total 6.2 4.5 - 12.0 ug/dL   T3 Uptake Ratio 25 24 - 39 %   Free Thyroxine Index 1.6 1.2 - 4.9      Assessment & Plan:   Problem List Items Addressed This Visit      Cardiovascular and Mediastinum   Hypertension - Primary    Chronic, stable with BP at goal at home and close to goal on visit today, has had increased stressors recently.  Continue current medication regimen and adjust as needed, refills sent.  BMP today.      Relevant Medications   benazepril (LOTENSIN) 20 MG tablet   simvastatin (ZOCOR) 20 MG tablet   Other Relevant Orders   Microalbumin, Urine Waived   Basic metabolic panel     Digestive   GERD (gastroesophageal reflux disease)    Chronic, stable.  Continue current medication regimen and adjust as needed.  Mag level today.      Relevant Orders   Magnesium     Other   Vitamin D deficiency    Ongoing, continue supplement and adjust dosing as needed.  Vit D level today on labs.      Relevant Orders   VITAMIN D 25 Hydroxy (Vit-D Deficiency, Fractures)   Prediabetes    Ongoing and improved, A1C 5.6% today.  Praised for success with diet and exercise.  Continue this focus and recheck A1C in 6 months to one year.      Relevant Orders   Bayer DCA Hb A1c Waived   Microalbumin, Urine Waived   Hyperlipidemia    Chronic, ongoing.  Continue current medication regimen.  Lipid panel today and adjust statin as needed.      Relevant Medications   benazepril (LOTENSIN) 20 MG tablet   simvastatin (ZOCOR) 20 MG tablet    Other Relevant Orders   Lipid Panel w/o Chol/HDL Ratio   Obesity    Recommend continued focus on healthy diet choices and regular physical activity (30 minutes 5 days a week).       Insomnia    New onset with loss of mother.  Will start Trazodone 25 to 50 MG as needed daily at bedtime.  Script sent.  Educated patient on medication and sleep hygiene techniques.  Return to office in 6 weeks for follow-up.          Follow up plan: Return in about 6 weeks (around 07/05/2019) for Insomnia.

## 2019-05-24 NOTE — Assessment & Plan Note (Signed)
Chronic, ongoing.  Continue current medication regimen.  Lipid panel today and adjust statin as needed. 

## 2019-05-24 NOTE — Patient Instructions (Signed)

## 2019-05-24 NOTE — Assessment & Plan Note (Signed)
Ongoing, continue supplement and adjust dosing as needed.  Vit D level today on labs.

## 2019-05-25 LAB — BASIC METABOLIC PANEL
BUN/Creatinine Ratio: 17 (ref 9–23)
BUN: 12 mg/dL (ref 6–24)
CO2: 24 mmol/L (ref 20–29)
Calcium: 9.8 mg/dL (ref 8.7–10.2)
Chloride: 104 mmol/L (ref 96–106)
Creatinine, Ser: 0.71 mg/dL (ref 0.57–1.00)
GFR calc Af Amer: 109 mL/min/{1.73_m2} (ref >59)
GFR calc non Af Amer: 94 mL/min/{1.73_m2} (ref >59)
Glucose: 99 mg/dL (ref 65–99)
Potassium: 4.1 mmol/L (ref 3.5–5.2)
Sodium: 143 mmol/L (ref 134–144)

## 2019-05-25 LAB — LIPID PANEL W/O CHOL/HDL RATIO
Cholesterol, Total: 163 mg/dL (ref 100–199)
HDL: 47 mg/dL (ref >39)
LDL Chol Calc (NIH): 99 mg/dL (ref 0–99)
Triglycerides: 92 mg/dL (ref 0–149)
VLDL Cholesterol Cal: 17 mg/dL (ref 5–40)

## 2019-05-25 LAB — MAGNESIUM: Magnesium: 2.2 mg/dL (ref 1.6–2.3)

## 2019-05-25 LAB — VITAMIN D 25 HYDROXY (VIT D DEFICIENCY, FRACTURES): Vit D, 25-Hydroxy: 31.3 ng/mL (ref 30.0–100.0)

## 2019-05-25 NOTE — Progress Notes (Signed)
Contacted via MyChart

## 2019-06-19 ENCOUNTER — Encounter: Payer: Self-pay | Admitting: Nurse Practitioner

## 2019-06-24 ENCOUNTER — Other Ambulatory Visit: Payer: Self-pay

## 2019-06-24 ENCOUNTER — Other Ambulatory Visit: Payer: BC Managed Care – PPO

## 2019-06-24 DIAGNOSIS — Z20822 Contact with and (suspected) exposure to covid-19: Secondary | ICD-10-CM

## 2019-06-25 LAB — SAR COV2 SEROLOGY (COVID19)AB(IGG),IA: DiaSorin SARS-CoV-2 Ab, IgG: NEGATIVE

## 2019-06-26 NOTE — Progress Notes (Signed)
Contacted via MyChart Good morning Carrie Arroyo.  Your Covid antibodies returned negative, meaning you most likely have not had an infection or have antibodies too Covid.  I definitely recommend vaccination to offer more protection.  If any questions or concerns let me know.  Have a great day!!

## 2019-07-12 ENCOUNTER — Ambulatory Visit: Payer: BC Managed Care – PPO | Admitting: Nurse Practitioner

## 2019-07-12 ENCOUNTER — Encounter: Payer: Self-pay | Admitting: Nurse Practitioner

## 2019-07-12 ENCOUNTER — Other Ambulatory Visit: Payer: Self-pay

## 2019-07-12 DIAGNOSIS — E6609 Other obesity due to excess calories: Secondary | ICD-10-CM

## 2019-07-12 DIAGNOSIS — Z6832 Body mass index (BMI) 32.0-32.9, adult: Secondary | ICD-10-CM | POA: Diagnosis not present

## 2019-07-12 DIAGNOSIS — F5104 Psychophysiologic insomnia: Secondary | ICD-10-CM

## 2019-07-12 NOTE — Assessment & Plan Note (Signed)
Chronic, stable with current Trazodone.  Recommend she try going up to 50 MG (full tablet) and see if increased benefit from this.  Continue Trazodone and adjust as needed, this offers benefit for mood with grieving and sleep.  Focus on sleep hygiene techniques at home.  Return in 6 months for annual physical.

## 2019-07-12 NOTE — Assessment & Plan Note (Signed)
Recommended eating smaller high protein, low fat meals more frequently and exercising 30 mins a day 5 times a week with a goal of 10-15lb weight loss in the next 3 months. Patient voiced their understanding and motivation to adhere to these recommendations.  

## 2019-07-12 NOTE — Progress Notes (Signed)
BP 135/82 (BP Location: Right Arm, Cuff Size: Normal)   Pulse 79   Temp 98.3 F (36.8 C) (Oral)   Wt 194 lb 12.8 oz (88.4 kg)   LMP  (LMP Unknown)   SpO2 100%   BMI 32.42 kg/m    Subjective:    Patient ID: Carrie Arroyo, female    DOB: [birth year], 59 y.o.   MRN: [id number]  HPI: Carrie Arroyo is a 59 y.o. female  Chief Complaint  Patient presents with  . Insomnia    6 week f/up   INSOMNIA Started on Trazodone 05/24/19, she feels it is "doing pretty good".  Has been taking 25 MG only so far.  Mother passed away suddenly and traumatically on 04/17/2019, she was not able to make it there in time to see her.  Denies depressed mood, is grieving.   Duration: months Satisfied with sleep quality: no Difficulty falling asleep: yes Difficulty staying asleep: yes Waking a few hours after sleep onset: yes Early morning awakenings: no Daytime hypersomnolence: no Wakes feeling refreshed: yes Good sleep hygiene: yes Apnea: no Snoring: no Depressed/anxious mood: yes Recent stress: yes Restless legs/nocturnal leg cramps: no Chronic pain/arthritis: no History of sleep study: no Treatments attempted: benadryl   Relevant past medical, surgical, family and social history reviewed and updated as indicated. Interim medical history since our last visit reviewed. Allergies and medications reviewed and updated.  Review of Systems  Constitutional: Negative for activity change, appetite change, diaphoresis, fatigue and fever.  Respiratory: Negative for cough, chest tightness and shortness of breath.   Cardiovascular: Negative for chest pain, palpitations and leg swelling.  Gastrointestinal: Negative.   Neurological: Negative.   Psychiatric/Behavioral: Negative.     Per HPI unless specifically indicated above     Objective:    BP 135/82 (BP Location: Right Arm, Cuff Size: Normal)   Pulse 79   Temp 98.3 F (36.8 C) (Oral)   Wt 194 lb 12.8 oz (88.4 kg)   LMP  (LMP Unknown)   SpO2  100%   BMI 32.42 kg/m   Wt Readings from Last 3 Encounters:  07/12/19 194 lb 12.8 oz (88.4 kg)  05/24/19 194 lb (88 kg)  05/17/19 193 lb (87.5 kg)    Physical Exam Vitals and nursing note reviewed.  Constitutional:      General: She is awake. She is not in acute distress.    Appearance: She is well-developed and overweight. She is not ill-appearing.  HENT:     Head: Normocephalic.     Right Ear: Hearing normal.     Left Ear: Hearing normal.  Eyes:     General: Lids are normal.        Right eye: No discharge.        Left eye: No discharge.     Conjunctiva/sclera: Conjunctivae normal.     Pupils: Pupils are equal, round, and reactive to light.  Neck:     Thyroid: No thyromegaly.     Vascular: No carotid bruit.  Cardiovascular:     Rate and Rhythm: Normal rate and regular rhythm.     Heart sounds: Normal heart sounds. No murmur. No gallop.   Pulmonary:     Effort: Pulmonary effort is normal.     Breath sounds: Normal breath sounds.  Abdominal:     General: Bowel sounds are normal.     Palpations: Abdomen is soft. There is no hepatomegaly or splenomegaly.  Musculoskeletal:     Cervical back: Normal range of  motion and neck supple.     Right lower leg: No edema.     Left lower leg: No edema.  Skin:    General: Skin is warm and dry.  Neurological:     Mental Status: She is alert and oriented to person, place, and time.  Psychiatric:        Attention and Perception: Attention normal.        Mood and Affect: Mood normal.        Speech: Speech normal.        Behavior: Behavior normal. Behavior is cooperative.        Thought Content: Thought content normal.     Results for orders placed or performed in visit on 06/24/19  SAR CoV2 Serology (COVID 19)AB(IGG)IA  Result Value Ref Range   DiaSorin SARS-CoV-2 Ab, IgG Negative Negative      Assessment & Plan:   Problem List Items Addressed This Visit      Other   Obesity    Recommended eating smaller high protein, low  fat meals more frequently and exercising 30 mins a day 5 times a week with a goal of 10-15lb weight loss in the next 3 months. Patient voiced their understanding and motivation to adhere to these recommendations.       Insomnia    Chronic, stable with current Trazodone.  Recommend she try going up to 50 MG (full tablet) and see if increased benefit from this.  Continue Trazodone and adjust as needed, this offers benefit for mood with grieving and sleep.  Focus on sleep hygiene techniques at home.  Return in 6 months for annual physical.          Follow up plan: Return in about 6 months (around 01/12/2020) for Annual Physical.

## 2019-07-12 NOTE — Patient Instructions (Signed)

## 2019-11-17 DIAGNOSIS — G4733 Obstructive sleep apnea (adult) (pediatric): Secondary | ICD-10-CM | POA: Diagnosis not present

## 2019-11-22 DIAGNOSIS — Z1231 Encounter for screening mammogram for malignant neoplasm of breast: Secondary | ICD-10-CM | POA: Diagnosis not present

## 2019-11-22 LAB — HM MAMMOGRAPHY

## 2020-01-05 DIAGNOSIS — D110 Benign neoplasm of parotid gland: Secondary | ICD-10-CM | POA: Diagnosis not present

## 2020-01-12 HISTORY — PX: DENTAL SURGERY: SHX609

## 2020-01-13 ENCOUNTER — Encounter: Payer: Self-pay | Admitting: Nurse Practitioner

## 2020-01-13 ENCOUNTER — Ambulatory Visit (INDEPENDENT_AMBULATORY_CARE_PROVIDER_SITE_OTHER): Payer: BC Managed Care – PPO | Admitting: Nurse Practitioner

## 2020-01-13 ENCOUNTER — Other Ambulatory Visit: Payer: Self-pay

## 2020-01-13 VITALS — BP 124/77 | HR 99 | Temp 98.1°F | Ht 64.2 in | Wt 191.2 lb

## 2020-01-13 DIAGNOSIS — Z0000 Encounter for general adult medical examination without abnormal findings: Secondary | ICD-10-CM

## 2020-01-13 DIAGNOSIS — Z1329 Encounter for screening for other suspected endocrine disorder: Secondary | ICD-10-CM | POA: Diagnosis not present

## 2020-01-13 DIAGNOSIS — Z13220 Encounter for screening for lipoid disorders: Secondary | ICD-10-CM | POA: Diagnosis not present

## 2020-01-13 MED ORDER — SIMVASTATIN 20 MG PO TABS: ORAL_TABLET | ORAL | 4 refills | Status: DC

## 2020-01-13 MED ORDER — BENAZEPRIL HCL 20 MG PO TABS: 20.0000 mg | ORAL_TABLET | Freq: Every day | ORAL | 4 refills | Status: DC

## 2020-01-13 NOTE — Progress Notes (Addendum)
BP 124/77   Pulse 99   Temp 98.1 F (36.7 C) (Oral)   Ht 5' 4.2" (1.631 m)   Wt 191 lb 3.2 oz (86.7 kg)   LMP  (LMP Unknown)   SpO2 98%   BMI 32.62 kg/m    Subjective:    Patient ID: Carrie Arroyo, female    DOB: 1960-12-12, 59 y.o.   MRN: [id number]  HPI: Carrie Arroyo is a 59 y.o. female presenting on 01/13/2020 for comprehensive medical examination. Current medical complaints include:none  She currently lives with: husband Menopausal Symptoms: no  Depression Screen done today and results listed below:  Depression screen [facility] 2/9 01/13/2020 11/16/2018 09/15/2017 08/04/2016 07/23/2015  Decreased Interest 0 0 0 0 0  Down, Depressed, Hopeless 0 0 0 0 0  PHQ - 2 Score 0 0 0 0 0  Altered sleeping - 0 0 - -  Tired, decreased energy - 0 0 - -  Change in appetite - 0 0 - -  Feeling bad or failure about yourself  - 0 0 - -  Trouble concentrating - 0 0 - -  Moving slowly or fidgety/restless - 0 0 - -  Suicidal thoughts - 0 0 - -  PHQ-9 Score - 0 0 - -  Difficult doing work/chores - Not difficult at all - - -    The patient does not have a history of falls. I did not complete a risk assessment for falls. A plan of care for falls was not documented.   Past Medical History:  Past Medical History:  Diagnosis Date  . Allergy   . Boils   . Elevated liver enzymes   . Humerus fracture    right  . Hypertension   . Sleep apnea     Surgical History:  Past Surgical History:  Procedure Laterality Date  . ABDOMINAL HYSTERECTOMY    . APPENDECTOMY    . DENTAL SURGERY  01/12/2020  . OVARIAN CYST REMOVAL    . PAROTIDECTOMY      Medications:  Current Outpatient Medications on File Prior to Visit  Medication Sig  . aspirin 81 MG tablet Take 81 mg by mouth daily.  . Cholecalciferol (VITAMIN D3) 50 MCG (2000 UT) CAPS Take 1 capsule by mouth daily.  . clobetasol cream (TEMOVATE) 0.05 % as needed.  . fluticasone (FLONASE) 50 MCG/ACT nasal spray Place 2 sprays into both nostrils  daily.  Marland Kitchen loratadine (CLARITIN) 10 MG tablet Take 10 mg by mouth daily.  . minocycline (MINOCIN,DYNACIN) 100 MG capsule TAKE ONE CAPSULE BY MOUTH TWICE A DAY FOR FLARES  . Multiple Vitamin (MULTIVITAMIN) tablet Take 1 tablet by mouth daily.  . mupirocin ointment (BACTROBAN) 2 % Place 1 application into the nose 2 (two) times daily.  Marland Kitchen omeprazole (PRILOSEC) 20 MG capsule Take 20 mg by mouth daily.   No current facility-administered medications on file prior to visit.    Allergies:  Allergies  Allergen Reactions  . Penicillin G Benzathine   . Sulfa Antibiotics     Social History:  Social History   Socioeconomic History  . Marital status: Married    Spouse name: Not on file  . Number of children: Not on file  . Years of education: Not on file  . Highest education level: Not on file  Occupational History  . Not on file  Tobacco Use  . Smoking status: Never Smoker  . Smokeless tobacco: Never Used  Vaping Use  . Vaping Use: Never used  Substance and Sexual Activity  . Alcohol use: Yes    Alcohol/week: 2.0 standard drinks    Types: 2 Glasses of wine per week  . Drug use: No  . Sexual activity: Never  Other Topics Concern  . Not on file  Social History Narrative  . Not on file   Social Determinants of Health   Financial Resource Strain:   . Difficulty of Paying Living Expenses: Not on file  Food Insecurity:   . Worried About Programme researcher, broadcasting/film/video in the Last Year: Not on file  . Ran Out of Food in the Last Year: Not on file  Transportation Needs:   . Lack of Transportation (Medical): Not on file  . Lack of Transportation (Non-Medical): Not on file  Physical Activity:   . Days of Exercise per Week: Not on file  . Minutes of Exercise per Session: Not on file  Stress:   . Feeling of Stress : Not on file  Social Connections:   . Frequency of Communication with Friends and Family: Not on file  . Frequency of Social Gatherings with Friends and Family: Not on file  .  Attends Religious Services: Not on file  . Active Member of Clubs or Organizations: Not on file  . Attends Banker Meetings: Not on file  . Marital Status: Not on file  Intimate Partner Violence:   . Fear of Current or Ex-Partner: Not on file  . Emotionally Abused: Not on file  . Physically Abused: Not on file  . Sexually Abused: Not on file   Social History   Tobacco Use  Smoking Status Never Smoker  Smokeless Tobacco Never Used   Social History   Substance and Sexual Activity  Alcohol Use Yes  . Alcohol/week: 2.0 standard drinks  . Types: 2 Glasses of wine per week    Family History:  Family History  Adopted: Yes  Family history unknown: Yes    Past medical history, surgical history, medications, allergies, family history and social history reviewed with patient today and changes made to appropriate areas of the chart.   Review of Systems - negative All other ROS negative except what is listed above and in the HPI.      Objective:    BP 124/77   Pulse 99   Temp 98.1 F (36.7 C) (Oral)   Ht 5' 4.2" (1.631 m)   Wt 191 lb 3.2 oz (86.7 kg)   LMP  (LMP Unknown)   SpO2 98%   BMI 32.62 kg/m   Wt Readings from Last 3 Encounters:  01/13/20 191 lb 3.2 oz (86.7 kg)  07/12/19 194 lb 12.8 oz (88.4 kg)  05/24/19 194 lb (88 kg)    Physical Exam Vitals and nursing note reviewed.  Constitutional:      General: She is awake. She is not in acute distress.    Appearance: She is well-developed. She is not ill-appearing.  HENT:     Head: Normocephalic and atraumatic.     Right Ear: Hearing, tympanic membrane, ear canal and external ear normal. No drainage.     Left Ear: Hearing, tympanic membrane, ear canal and external ear normal. No drainage.     Nose: Nose normal.     Right Sinus: No maxillary sinus tenderness or frontal sinus tenderness.     Left Sinus: No maxillary sinus tenderness or frontal sinus tenderness.     Mouth/Throat:     Mouth: Mucous  membranes are moist.     Pharynx:  Oropharynx is clear. Uvula midline. No pharyngeal swelling, oropharyngeal exudate or posterior oropharyngeal erythema.  Eyes:     General: Lids are normal.        Right eye: No discharge.        Left eye: No discharge.     Extraocular Movements: Extraocular movements intact.     Conjunctiva/sclera: Conjunctivae normal.     Pupils: Pupils are equal, round, and reactive to light.     Visual Fields: Right eye visual fields normal and left eye visual fields normal.  Neck:     Thyroid: No thyromegaly.     Vascular: No carotid bruit.     Trachea: Trachea normal.  Cardiovascular:     Rate and Rhythm: Normal rate and regular rhythm.     Heart sounds: Normal heart sounds. No murmur heard.  No gallop.   Pulmonary:     Effort: Pulmonary effort is normal. No accessory muscle usage or respiratory distress.     Breath sounds: Normal breath sounds.  Chest:     Breasts:        Right: Normal.        Left: Normal.  Abdominal:     General: Bowel sounds are normal.     Palpations: Abdomen is soft. There is no hepatomegaly or splenomegaly.     Tenderness: There is no abdominal tenderness.  Musculoskeletal:        General: Normal range of motion.     Cervical back: Normal range of motion and neck supple.     Right lower leg: No edema.     Left lower leg: No edema.  Lymphadenopathy:     Head:     Right side of head: No submental, submandibular, tonsillar, preauricular or posterior auricular adenopathy.     Left side of head: No submental, submandibular, tonsillar, preauricular or posterior auricular adenopathy.     Cervical: No cervical adenopathy.     Upper Body:     Right upper body: No supraclavicular, axillary or pectoral adenopathy.     Left upper body: No supraclavicular, axillary or pectoral adenopathy.  Skin:    General: Skin is warm and dry.     Capillary Refill: Capillary refill takes less than 2 seconds.     Findings: No rash.  Neurological:      Mental Status: She is alert and oriented to person, place, and time.     Cranial Nerves: Cranial nerves are intact.     Gait: Gait is intact.     Deep Tendon Reflexes: Reflexes are normal and symmetric.     Reflex Scores:      Brachioradialis reflexes are 2+ on the right side and 2+ on the left side.      Patellar reflexes are 2+ on the right side and 2+ on the left side. Psychiatric:        Attention and Perception: Attention normal.        Mood and Affect: Mood normal.        Speech: Speech normal.        Behavior: Behavior normal. Behavior is cooperative.        Thought Content: Thought content normal.        Judgment: Judgment normal.    Results for orders placed or performed in visit on 01/13/20  HM MAMMOGRAPHY  Result Value Ref Range   HM Mammogram 0-4 Bi-Rad 0-4 Bi-Rad, Self Reported Normal      Assessment & Plan:   Problem List Items Addressed This  Visit    None    Visit Diagnoses    Encounter for annual physical exam    -  Primary   Annual physical labs today obtained: CBC, CMP, TSH, Lipid   Relevant Orders   CBC with Differential/Platelet   Comprehensive metabolic panel   Thyroid disorder screen       TSH on screening labs today   Relevant Orders   TSH   Screening cholesterol level       Lipid panel today   Relevant Orders   Lipid Panel w/o Chol/HDL Ratio       Follow up plan: Return in about 6 months (around 07/12/2020) for HTN/HLD, PREDIABETES.   LABORATORY TESTING:  - Pap smear: not applicable -- hysterectomy in 2000  IMMUNIZATIONS:   - Tdap: Tetanus vaccination status reviewed: last tetanus booster within 10 years. - Influenza: Refused - Pneumovax: Not applicable - Prevnar: Not applicable - HPV: Not applicable - Zostavax vaccine: refused  SCREENING: -Mammogram: Up to date  - Colonoscopy: Up to date  - Bone Density: Not applicable  -Hearing Test: Not applicable  -Spirometry: Not applicable   PATIENT COUNSELING:   Advised to take 1 mg of  folate supplement per day if capable of pregnancy.   Sexuality: Discussed sexually transmitted diseases, partner selection, use of condoms, avoidance of unintended pregnancy  and contraceptive alternatives.   Advised to avoid cigarette smoking.  I discussed with the patient that most people either abstain from alcohol or drink within safe limits (<=14/week and <=4 drinks/occasion for males, <=7/weeks and <= 3 drinks/occasion for females) and that the risk for alcohol disorders and other health effects rises proportionally with the number of drinks per week and how often a drinker exceeds daily limits.  Discussed cessation/primary prevention of drug use and availability of treatment for abuse.   Diet: Encouraged to adjust caloric intake to maintain  or achieve ideal body weight, to reduce intake of dietary saturated fat and total fat, to limit sodium intake by avoiding high sodium foods and not adding table salt, and to maintain adequate dietary potassium and calcium preferably from fresh fruits, vegetables, and low-fat dairy products.    Stressed the importance of regular exercise  Injury prevention: Discussed safety belts, safety helmets, smoke detector, smoking near bedding or upholstery.   Dental health: Discussed importance of regular tooth brushing, flossing, and dental visits.    NEXT PREVENTATIVE PHYSICAL DUE IN 1 YEAR. Return in about 6 months (around 07/12/2020) for HTN/HLD, PREDIABETES.

## 2020-01-13 NOTE — Patient Instructions (Signed)
Healthy Eating Following a healthy eating pattern may help you to achieve and maintain a healthy body weight, reduce the risk of chronic disease, and live a long and productive life. It is important to follow a healthy eating pattern at an appropriate calorie level for your body. Your nutritional needs should be met primarily through food by choosing a variety of nutrient-rich foods. What are tips for following this plan? Reading food labels  Read labels and choose the following: ? Reduced or low sodium. ? Juices with 100% fruit juice. ? Foods with low saturated fats and high polyunsaturated and monounsaturated fats. ? Foods with whole grains, such as whole wheat, cracked wheat, brown rice, and wild rice. ? Whole grains that are fortified with folic acid. This is recommended for women who are pregnant or who want to become pregnant.  Read labels and avoid the following: ? Foods with a lot of added sugars. These include foods that contain brown sugar, corn sweetener, corn syrup, dextrose, fructose, glucose, high-fructose corn syrup, honey, invert sugar, lactose, malt syrup, maltose, molasses, raw sugar, sucrose, trehalose, or turbinado sugar.  Do not eat more than the following amounts of added sugar per day:  6 teaspoons (25 g) for women.  9 teaspoons (38 g) for men. ? Foods that contain processed or refined starches and grains. ? Refined grain products, such as white flour, degermed cornmeal, white bread, and white rice. Shopping  Choose nutrient-rich snacks, such as vegetables, whole fruits, and nuts. Avoid high-calorie and high-sugar snacks, such as potato chips, fruit snacks, and candy.  Use oil-based dressings and spreads on foods instead of solid fats such as butter, stick margarine, or cream cheese.  Limit pre-made sauces, mixes, and "instant" products such as flavored rice, instant noodles, and ready-made pasta.  Try more plant-protein sources, such as tofu, tempeh, black beans,  edamame, lentils, nuts, and seeds.  Explore eating plans such as the Mediterranean diet or vegetarian diet. Cooking  Use oil to saut or stir-fry foods instead of solid fats such as butter, stick margarine, or lard.  Try baking, boiling, grilling, or broiling instead of frying.  Remove the fatty part of meats before cooking.  Steam vegetables in water or broth. Meal planning   At meals, imagine dividing your plate into fourths: ? One-half of your plate is fruits and vegetables. ? One-fourth of your plate is whole grains. ? One-fourth of your plate is protein, especially lean meats, poultry, eggs, tofu, beans, or nuts.  Include low-fat dairy as part of your daily diet. Lifestyle  Choose healthy options in all settings, including home, work, school, restaurants, or stores.  Prepare your food safely: ? Wash your hands after handling raw meats. ? Keep food preparation surfaces clean by regularly washing with hot, soapy water. ? Keep raw meats separate from ready-to-eat foods, such as fruits and vegetables. ? Cook seafood, meat, poultry, and eggs to the recommended internal temperature. ? Store foods at safe temperatures. In general:  Keep cold foods at 59F (4.4C) or below.  Keep hot foods at 159F (60C) or above.  Keep your freezer at [facility] (-17.8C) or below.  Foods are no longer safe to eat when they have been between the temperatures of 40-159F (4.4-60C) for more than 2 hours. What foods should I eat? Fruits Aim to eat 2 cup-equivalents of fresh, canned (in natural juice), or frozen fruits each day. Examples of 1 cup-equivalent of fruit include 1 small apple, 8 large strawberries, 1 cup canned fruit,  cup  dried fruit, or 1 cup 100% juice. Vegetables Aim to eat 2-3 cup-equivalents of fresh and frozen vegetables each day, including different varieties and colors. Examples of 1 cup-equivalent of vegetables include 2 medium carrots, 2 cups raw, leafy greens, 1 cup chopped  vegetable (raw or cooked), or 1 medium baked potato. Grains Aim to eat 6 ounce-equivalents of whole grains each day. Examples of 1 ounce-equivalent of grains include 1 slice of bread, 1 cup ready-to-eat cereal, 3 cups popcorn, or  cup cooked rice, pasta, or cereal. Meats and other proteins Aim to eat 5-6 ounce-equivalents of protein each day. Examples of 1 ounce-equivalent of protein include 1 egg, 1/2 cup nuts or seeds, or 1 tablespoon (16 g) peanut butter. A cut of meat or fish that is the size of a deck of cards is about 3-4 ounce-equivalents.  Of the protein you eat each week, try to have at least 8 ounces come from seafood. This includes salmon, trout, herring, and anchovies. Dairy Aim to eat 3 cup-equivalents of fat-free or low-fat dairy each day. Examples of 1 cup-equivalent of dairy include 1 cup (240 mL) milk, 8 ounces (250 g) yogurt, 1 ounces (44 g) natural cheese, or 1 cup (240 mL) fortified soy milk. Fats and oils  Aim for about 5 teaspoons (21 g) per day. Choose monounsaturated fats, such as canola and olive oils, avocados, peanut butter, and most nuts, or polyunsaturated fats, such as sunflower, corn, and soybean oils, walnuts, pine nuts, sesame seeds, sunflower seeds, and flaxseed. Beverages  Aim for six 8-oz glasses of water per day. Limit coffee to three to five 8-oz cups per day.  Limit caffeinated beverages that have added calories, such as soda and energy drinks.  Limit alcohol intake to no more than 1 drink a day for nonpregnant women and 2 drinks a day for men. One drink equals 12 oz of beer (355 mL), 5 oz of wine (148 mL), or 1 oz of hard liquor (44 mL). Seasoning and other foods  Avoid adding excess amounts of salt to your foods. Try flavoring foods with herbs and spices instead of salt.  Avoid adding sugar to foods.  Try using oil-based dressings, sauces, and spreads instead of solid fats. This information is based on general [place] nutrition guidelines. For more  information, visit [employer]. Exact amounts may vary based on your nutrition needs. Summary  A healthy eating plan may help you to maintain a healthy weight, reduce the risk of chronic diseases, and stay active throughout your life.  Plan your meals. Make sure you eat the right portions of a variety of nutrient-rich foods.  Try baking, boiling, grilling, or broiling instead of frying.  Choose healthy options in all settings, including home, work, school, restaurants, or stores. This information is not intended to replace advice given to you by your health care provider. Make sure you discuss any questions you have with your health care provider. Document Revised: 06/01/2017 Document Reviewed: 06/01/2017 Elsevier Patient Education  [place].

## 2020-01-14 LAB — CBC WITH DIFFERENTIAL/PLATELET
Basophils Absolute: 0.1 10*3/uL (ref 0.0–0.2)
Basos: 0 %
EOS (ABSOLUTE): 0.2 10*3/uL (ref 0.0–0.4)
Eos: 2 %
Hematocrit: 45 % (ref 34.0–46.6)
Hemoglobin: 15 g/dL (ref 11.1–15.9)
Immature Grans (Abs): 0 10*3/uL (ref 0.0–0.1)
Immature Granulocytes: 0 %
Lymphocytes Absolute: 2.9 10*3/uL (ref 0.7–3.1)
Lymphs: 23 %
MCH: 31.6 pg (ref 26.6–33.0)
MCHC: 33.3 g/dL (ref 31.5–35.7)
MCV: 95 fL (ref 79–97)
Monocytes Absolute: 1 10*3/uL — ABNORMAL HIGH (ref 0.1–0.9)
Monocytes: 8 %
Neutrophils Absolute: 8.6 10*3/uL — ABNORMAL HIGH (ref 1.4–7.0)
Neutrophils: 67 %
Platelets: 354 10*3/uL (ref 150–450)
RBC: 4.75 x10E6/uL (ref 3.77–5.28)
RDW: 11.9 % (ref 11.7–15.4)
WBC: 12.8 10*3/uL — ABNORMAL HIGH (ref 3.4–10.8)

## 2020-01-14 LAB — COMPREHENSIVE METABOLIC PANEL
ALT: 20 IU/L (ref 0–32)
AST: 19 IU/L (ref 0–40)
Albumin/Globulin Ratio: 2.1 (ref 1.2–2.2)
Albumin: 4.7 g/dL (ref 3.8–4.9)
Alkaline Phosphatase: 111 IU/L (ref 44–121)
BUN/Creatinine Ratio: 13 (ref 9–23)
BUN: 8 mg/dL (ref 6–24)
Bilirubin Total: 0.3 mg/dL (ref 0.0–1.2)
CO2: 26 mmol/L (ref 20–29)
Calcium: 10.1 mg/dL (ref 8.7–10.2)
Chloride: 99 mmol/L (ref 96–106)
Creatinine, Ser: 0.61 mg/dL (ref 0.57–1.00)
GFR calc Af Amer: 116 mL/min/{1.73_m2} (ref >59)
GFR calc non Af Amer: 100 mL/min/{1.73_m2} (ref >59)
Globulin, Total: 2.2 g/dL (ref 1.5–4.5)
Glucose: 96 mg/dL (ref 65–99)
Potassium: 4 mmol/L (ref 3.5–5.2)
Sodium: 140 mmol/L (ref 134–144)
Total Protein: 6.9 g/dL (ref 6.0–8.5)

## 2020-01-14 LAB — LIPID PANEL W/O CHOL/HDL RATIO
Cholesterol, Total: 192 mg/dL (ref 100–199)
HDL: 42 mg/dL (ref >39)
LDL Chol Calc (NIH): 116 mg/dL — ABNORMAL HIGH (ref 0–99)
Triglycerides: 195 mg/dL — ABNORMAL HIGH (ref 0–149)
VLDL Cholesterol Cal: 34 mg/dL (ref 5–40)

## 2020-01-14 LAB — TSH: TSH: 0.84 u[IU]/mL (ref 0.450–4.500)

## 2020-01-15 ENCOUNTER — Other Ambulatory Visit: Payer: Self-pay | Admitting: Nurse Practitioner

## 2020-01-15 DIAGNOSIS — D72829 Elevated white blood cell count, unspecified: Secondary | ICD-10-CM

## 2020-01-15 NOTE — Progress Notes (Signed)
Contacted via MyChart  Good morning Carrie Arroyo, your labs have returned: - Kidney, liver, and thyroid labs are normal - CBC does show some mild elevation on white blood cell count and neutrophils.  Have you been sick recently?  When did you get your last vaccine?  These can bump these numbers up.  I would like to recheck via an outpatient lab visit in 4 weeks, please schedule this for recheck.   - Your cholesterol levels continue to show LDL above goal, would like to see less than 70 for stroke prevention.  I would be good to increase your Simvastatin to 40 MG daily.  Would you be okay with this to see if we can get better control?  Let me know and I can send in increased dose.  Any questions for me? Keep being awesome!!  Thank you for allowing me to participate in your care. Kindest regards, Emran Molzahn

## 2020-01-15 NOTE — Progress Notes (Signed)
CBC recheck due to wbc elevation

## 2020-01-16 ENCOUNTER — Other Ambulatory Visit: Payer: Self-pay | Admitting: Nurse Practitioner

## 2020-01-16 MED ORDER — SIMVASTATIN 40 MG PO TABS: 40.0000 mg | ORAL_TABLET | Freq: Every day | ORAL | 3 refills | Status: DC

## 2020-01-18 DIAGNOSIS — L089 Local infection of the skin and subcutaneous tissue, unspecified: Secondary | ICD-10-CM | POA: Diagnosis not present

## 2020-01-18 DIAGNOSIS — X32XXXA Exposure to sunlight, initial encounter: Secondary | ICD-10-CM | POA: Diagnosis not present

## 2020-01-18 DIAGNOSIS — L821 Other seborrheic keratosis: Secondary | ICD-10-CM | POA: Diagnosis not present

## 2020-01-18 DIAGNOSIS — L732 Hidradenitis suppurativa: Secondary | ICD-10-CM | POA: Diagnosis not present

## 2020-01-18 DIAGNOSIS — Z85828 Personal history of other malignant neoplasm of skin: Secondary | ICD-10-CM | POA: Diagnosis not present

## 2020-01-18 DIAGNOSIS — L570 Actinic keratosis: Secondary | ICD-10-CM | POA: Diagnosis not present

## 2020-01-18 DIAGNOSIS — Z08 Encounter for follow-up examination after completed treatment for malignant neoplasm: Secondary | ICD-10-CM | POA: Diagnosis not present

## 2020-03-09 DIAGNOSIS — G4733 Obstructive sleep apnea (adult) (pediatric): Secondary | ICD-10-CM | POA: Diagnosis not present

## 2020-04-11 DIAGNOSIS — Z9989 Dependence on other enabling machines and devices: Secondary | ICD-10-CM | POA: Diagnosis not present

## 2020-04-11 DIAGNOSIS — G4733 Obstructive sleep apnea (adult) (pediatric): Secondary | ICD-10-CM | POA: Diagnosis not present

## 2020-04-11 DIAGNOSIS — E669 Obesity, unspecified: Secondary | ICD-10-CM | POA: Diagnosis not present

## 2020-04-11 DIAGNOSIS — F17200 Nicotine dependence, unspecified, uncomplicated: Secondary | ICD-10-CM | POA: Diagnosis not present

## 2020-07-07 ENCOUNTER — Encounter: Payer: Self-pay | Admitting: Nurse Practitioner

## 2020-07-12 ENCOUNTER — Other Ambulatory Visit: Payer: Self-pay

## 2020-07-12 ENCOUNTER — Encounter: Payer: Self-pay | Admitting: Nurse Practitioner

## 2020-07-12 ENCOUNTER — Ambulatory Visit: Payer: BC Managed Care – PPO | Admitting: Nurse Practitioner

## 2020-07-12 VITALS — BP 115/77 | HR 83 | Temp 98.6°F | Wt 187.6 lb

## 2020-07-12 DIAGNOSIS — R7989 Other specified abnormal findings of blood chemistry: Secondary | ICD-10-CM

## 2020-07-12 DIAGNOSIS — R7303 Prediabetes: Secondary | ICD-10-CM | POA: Diagnosis not present

## 2020-07-12 DIAGNOSIS — K219 Gastro-esophageal reflux disease without esophagitis: Secondary | ICD-10-CM

## 2020-07-12 DIAGNOSIS — I10 Essential (primary) hypertension: Secondary | ICD-10-CM

## 2020-07-12 DIAGNOSIS — Z9989 Dependence on other enabling machines and devices: Secondary | ICD-10-CM

## 2020-07-12 DIAGNOSIS — E6609 Other obesity due to excess calories: Secondary | ICD-10-CM

## 2020-07-12 DIAGNOSIS — E7800 Pure hypercholesterolemia, unspecified: Secondary | ICD-10-CM | POA: Diagnosis not present

## 2020-07-12 DIAGNOSIS — Z6832 Body mass index (BMI) 32.0-32.9, adult: Secondary | ICD-10-CM

## 2020-07-12 DIAGNOSIS — G4733 Obstructive sleep apnea (adult) (pediatric): Secondary | ICD-10-CM

## 2020-07-12 DIAGNOSIS — E559 Vitamin D deficiency, unspecified: Secondary | ICD-10-CM

## 2020-07-12 LAB — MICROALBUMIN, URINE WAIVED
Creatinine, Urine Waived: 50 mg/dL (ref 10–300)
Microalb, Ur Waived: 10 mg/L (ref 0–19)
Microalb/Creat Ratio: <30 mg/g (ref <30)

## 2020-07-12 NOTE — Patient Instructions (Signed)

## 2020-07-12 NOTE — Assessment & Plan Note (Addendum)
Ongoing and improved, A1C 5.6% last visit.  Praised for success with diet and exercise.  Continue this focus and recheck A1C.

## 2020-07-12 NOTE — Assessment & Plan Note (Signed)
Chronic, stable.  Continue current medication regimen and adjust as needed.  Mag level next visit. 

## 2020-07-12 NOTE — Assessment & Plan Note (Signed)
BMI 32.00.  Recommended eating smaller high protein, low fat meals more frequently and exercising 30 mins a day 5 times a week with a goal of 10-15lb weight loss in the next 3 months. Patient voiced their understanding and motivation to adhere to these recommendations.

## 2020-07-12 NOTE — Addendum Note (Signed)
Addended by: Keyleigh Manninen on: 07/12/2020 02:39 PM   Modules accepted: Orders  

## 2020-07-12 NOTE — Assessment & Plan Note (Signed)
Chronic, ongoing.  Continue current medication regimen.  Lipid panel today and adjust statin as needed. 

## 2020-07-12 NOTE — Assessment & Plan Note (Signed)
Ongoing, continue supplement and adjust dosing as needed.  Vit D level next visit.

## 2020-07-12 NOTE — Assessment & Plan Note (Signed)
Recheck level today 

## 2020-07-12 NOTE — Assessment & Plan Note (Signed)
Chronic, stable with BP at goal today.  Recommend she monitor BP at least a few mornings a week at home and document.  DASH diet at home.  Continue current medication regimen and adjust as needed.  Labs today: CMP, urine ALB, TSH.  Return in 6 months for physical.

## 2020-07-12 NOTE — Progress Notes (Signed)
BP 115/77   Pulse 83   Temp 98.6 F (37 C) (Oral)   Wt 187 lb 9.6 oz (85.1 kg)   LMP  (LMP Unknown)   SpO2 99%   BMI 32.00 kg/m    Subjective:    Patient ID: Carrie Arroyo, female    DOB: 1960/03/06, 60 y.o.   MRN: [id number]  HPI: Carrie Arroyo is a 60 y.o. female  Chief Complaint  Patient presents with  . Hypertension  . Hyperlipidemia  . Prediabetes    HYPERTENSION / HYPERLIPIDEMIA Continues on Benazepril, Simvastatin, ASA.  She continues to focus on diet and exercise and has lost more weight.  No smoking (former smoker), just occasional alcohol use.  She saw neurology on 04/11/20 -- currently is awaiting new supplies -- when present she uses every night. Satisfied with current treatment? yes Duration of hypertension: chronic BP monitoring frequency:  BP range:  BP medication side effects: no Duration of hyperlipidemia: chronic Cholesterol medication side effects: no Cholesterol supplements: none Medication compliance: good compliance Aspirin: yes Recent stressors: no Recurrent headaches: no Visual changes: no Palpitations: no Dyspnea: no Chest pain: no Lower extremity edema: no Dizzy/lightheaded: no   PREDIABETES: Last A1c in March 5.6%.   Hypoglycemic episodes:no Polydipsia/polyuria: no Visual disturbance: no Chest pain: no Paresthesias: no   GERD Continues on Prilosec, generic.  She has tried cutting back slowly, but heart burn returns. GERD control status: stable  Satisfied with current treatment? yes Heartburn frequency: none Medication side effects: no  Medication compliance: stable Previous GERD medications: TUMS Antacid use frequency:  none  Dysphagia: no Odynophagia:  no Hematemesis: no Blood in stool: no EGD: yes  VITAMIN D DEFICIENCY: No current supplement, last level 31.3 in March 2021  Denies any recent falls or fractures.  No increased fatigue or muscle pain.  Relevant past medical, surgical, family and social history reviewed  and updated as indicated. Interim medical history since our last visit reviewed. Allergies and medications reviewed and updated.  Review of Systems  Constitutional: Negative for activity change, appetite change, diaphoresis, fatigue and fever.  Respiratory: Negative for cough, chest tightness and shortness of breath.   Cardiovascular: Negative for chest pain, palpitations and leg swelling.  Gastrointestinal: Negative.   Endocrine: Negative for cold intolerance, heat intolerance, polydipsia, polyphagia and polyuria.  Neurological: Negative.   Psychiatric/Behavioral: Negative.     Per HPI unless specifically indicated above     Objective:    BP 115/77   Pulse 83   Temp 98.6 F (37 C) (Oral)   Wt 187 lb 9.6 oz (85.1 kg)   LMP  (LMP Unknown)   SpO2 99%   BMI 32.00 kg/m   Wt Readings from Last 3 Encounters:  07/12/20 187 lb 9.6 oz (85.1 kg)  01/13/20 191 lb 3.2 oz (86.7 kg)  07/12/19 194 lb 12.8 oz (88.4 kg)    Physical Exam Vitals and nursing note reviewed.  Constitutional:      General: She is awake. She is not in acute distress.    Appearance: She is well-developed and overweight. She is not ill-appearing.  HENT:     Head: Normocephalic.     Right Ear: Hearing normal.     Left Ear: Hearing normal.  Eyes:     General: Lids are normal.        Right eye: No discharge.        Left eye: No discharge.     Conjunctiva/sclera: Conjunctivae normal.     Pupils:  Pupils are equal, round, and reactive to light.  Neck:     Thyroid: No thyromegaly.     Vascular: No carotid bruit.  Cardiovascular:     Rate and Rhythm: Normal rate and regular rhythm.     Heart sounds: Normal heart sounds. No murmur heard. No gallop.   Pulmonary:     Effort: Pulmonary effort is normal.     Breath sounds: Normal breath sounds.  Abdominal:     General: Bowel sounds are normal.     Palpations: Abdomen is soft. There is no hepatomegaly or splenomegaly.  Musculoskeletal:     Cervical back: Normal  range of motion and neck supple.     Right lower leg: No edema.     Left lower leg: No edema.  Skin:    General: Skin is warm and dry.  Neurological:     Mental Status: She is alert and oriented to person, place, and time.  Psychiatric:        Attention and Perception: Attention normal.        Mood and Affect: Mood normal.        Behavior: Behavior normal. Behavior is cooperative.        Thought Content: Thought content normal.        Judgment: Judgment normal.     Results for orders placed or performed in visit on 02/28/20  HM MAMMOGRAPHY  Result Value Ref Range   HM Mammogram 0-4 Bi-Rad 0-4 Bi-Rad, Self Reported Normal      Assessment & Plan:   Problem List Items Addressed This Visit      Cardiovascular and Mediastinum   Hypertension - Primary    Chronic, stable with BP at goal today.  Recommend she monitor BP at least a few mornings a week at home and document.  DASH diet at home.  Continue current medication regimen and adjust as needed.  Labs today: CMP, urine ALB, TSH.  Return in 6 months for physical.       Relevant Orders   Microalbumin, Urine Waived   Comprehensive metabolic panel   TSH     Respiratory   OSA on CPAP    Chronic, ongoing. Currently stable on CPAP and recently saw neurology.        Digestive   GERD (gastroesophageal reflux disease)    Chronic, stable.  Continue current medication regimen and adjust as needed.  Mag level next visit.        Other   Vitamin D deficiency    Ongoing, continue supplement and adjust dosing as needed.  Vit D level next visit.      Prediabetes    Ongoing and improved, A1C 5.6% last visit.  Praised for success with diet and exercise.  Continue this focus and recheck A1C.      Relevant Orders   Hemoglobin A1c   Hyperlipidemia    Chronic, ongoing.  Continue current medication regimen.  Lipid panel today and adjust statin as needed.      Relevant Orders   Lipid Panel w/o Chol/HDL Ratio   Obesity    BMI  32.00.  Recommended eating smaller high protein, low fat meals more frequently and exercising 30 mins a day 5 times a week with a goal of 10-15lb weight loss in the next 3 months. Patient voiced their understanding and motivation to adhere to these recommendations.        Low TSH level    Recheck level today.  Follow up plan: Return in about 6 months (around 01/12/2021) for Annual physical.

## 2020-07-12 NOTE — Assessment & Plan Note (Signed)
Chronic, ongoing. Currently stable on CPAP and recently saw neurology.

## 2020-07-13 LAB — LIPID PANEL W/O CHOL/HDL RATIO
Cholesterol, Total: 172 mg/dL (ref 100–199)
HDL: 49 mg/dL (ref >39)
LDL Chol Calc (NIH): 95 mg/dL (ref 0–99)
Triglycerides: 161 mg/dL — ABNORMAL HIGH (ref 0–149)
VLDL Cholesterol Cal: 28 mg/dL (ref 5–40)

## 2020-07-13 LAB — COMPREHENSIVE METABOLIC PANEL
ALT: 28 IU/L (ref 0–32)
AST: 24 IU/L (ref 0–40)
Albumin/Globulin Ratio: 1.9 (ref 1.2–2.2)
Albumin: 5.1 g/dL — ABNORMAL HIGH (ref 3.8–4.9)
Alkaline Phosphatase: 105 IU/L (ref 44–121)
BUN/Creatinine Ratio: 16 (ref 9–23)
BUN: 10 mg/dL (ref 6–24)
Bilirubin Total: 0.2 mg/dL (ref 0.0–1.2)
CO2: 25 mmol/L (ref 20–29)
Calcium: 10.4 mg/dL — ABNORMAL HIGH (ref 8.7–10.2)
Chloride: 98 mmol/L (ref 96–106)
Creatinine, Ser: 0.63 mg/dL (ref 0.57–1.00)
Globulin, Total: 2.7 g/dL (ref 1.5–4.5)
Glucose: 97 mg/dL (ref 65–99)
Potassium: 4.3 mmol/L (ref 3.5–5.2)
Sodium: 141 mmol/L (ref 134–144)
Total Protein: 7.8 g/dL (ref 6.0–8.5)
eGFR: 102 mL/min/{1.73_m2} (ref >59)

## 2020-07-13 LAB — TSH: TSH: 1.14 u[IU]/mL (ref 0.450–4.500)

## 2020-07-13 LAB — HEMOGLOBIN A1C
Est. average glucose Bld gHb Est-mCnc: 123 mg/dL
Hgb A1c MFr Bld: 5.9 % — ABNORMAL HIGH (ref 4.8–5.6)

## 2020-07-13 NOTE — Progress Notes (Signed)
Contacted via MyChart   Good evening Carrie Arroyo, your labs have returned and overall they look good.  Calcium mildly elevated at 10.4, we will recheck this next visit.  Triglycerides slightly elevated, I would recommend less sugar and processed food in diet to help lower these and continue statin.  Thyroid normal and A1c remains in prediabetic range at 5.9%, continue diet focus.  Any questions? Keep being awesome!!  Thank you for allowing me to participate in your care.  I appreciate you. Kindest regards, Naomie Crow

## 2020-07-23 ENCOUNTER — Ambulatory Visit: Payer: BC Managed Care – PPO | Admitting: Nurse Practitioner

## 2020-07-23 ENCOUNTER — Encounter: Payer: Self-pay | Admitting: Nurse Practitioner

## 2020-07-23 ENCOUNTER — Other Ambulatory Visit: Payer: Self-pay

## 2020-07-23 ENCOUNTER — Ambulatory Visit
Admission: RE | Admit: 2020-07-23 | Discharge: 2020-07-23 | Disposition: A | Discharge status: Home / Self Care | Payer: BC Managed Care – PPO | Source: Ambulatory Visit | Attending: Nurse Practitioner | Admitting: Nurse Practitioner

## 2020-07-23 ENCOUNTER — Ambulatory Visit
Admission: RE | Admit: 2020-07-23 | Discharge: 2020-07-23 | Disposition: A | Discharge status: Home / Self Care | Payer: BC Managed Care – PPO | Attending: Nurse Practitioner | Admitting: Nurse Practitioner

## 2020-07-23 VITALS — BP 136/82 | HR 79 | Temp 98.3°F | Wt 187.0 lb

## 2020-07-23 DIAGNOSIS — R0781 Pleurodynia: Secondary | ICD-10-CM | POA: Insufficient documentation

## 2020-07-23 DIAGNOSIS — N644 Mastodynia: Secondary | ICD-10-CM | POA: Diagnosis not present

## 2020-07-23 DIAGNOSIS — M25512 Pain in left shoulder: Secondary | ICD-10-CM | POA: Diagnosis not present

## 2020-07-23 DIAGNOSIS — W19XXXA Unspecified fall, initial encounter: Secondary | ICD-10-CM | POA: Insufficient documentation

## 2020-07-23 DIAGNOSIS — S2242XA Multiple fractures of ribs, left side, initial encounter for closed fracture: Secondary | ICD-10-CM | POA: Diagnosis not present

## 2020-07-23 MED ORDER — HYDROCODONE-ACETAMINOPHEN 10-325 MG PO TABS: 1.0000 | ORAL_TABLET | Freq: Three times a day (TID) | ORAL | 0 refills | Status: AC | PRN

## 2020-07-23 NOTE — Assessment & Plan Note (Signed)
Acute, post fall one day ago.  Will obtain imaging, low suspicion for fracture to shoulder, but is having some pain.  Recommend alternating ice and heat at home + resting shoulder.  Will send in short burst of Norco to be used as needed only for severe pain.  If fracture present will send to ortho.  Return in one week for follow-up.

## 2020-07-23 NOTE — Assessment & Plan Note (Signed)
Refer to shoulder and rib pain plan of care.

## 2020-07-23 NOTE — Assessment & Plan Note (Signed)
Acute, post fall one day ago.  Will obtain imaging to ensure no fracture as is tender to rib area, no crepitus noted.  Recommend she ensure to perform deep breathing exercises at least every 4 hours and educated on how to perform to avoid PNA.  Short burst of Norco provided to use as needed for severe pain only, recommend to try using Tylenol as needed for mild to moderate pain -- max 3000 MG total a day of Acetaminophen between Norco and Tylenol.  Alternated heat and ice to area.  Rest.  Return to office in one week.

## 2020-07-23 NOTE — Patient Instructions (Signed)
[address], [place], [place] [address] for imaging  Chest Wall Pain Chest wall pain is pain in or around the bones and muscles of your chest. Sometimes, an injury causes this pain. Excessive coughing or overuse of arm and chest muscles may also cause chest wall pain. Sometimes, the cause may not be known. This pain may take several weeks or longer to get better. Follow these instructions at home: Managing pain, stiffness, and swelling  If directed, put ice on the painful area: ? Put ice in a plastic bag. ? Place a towel between your skin and the bag. ? Leave the ice on for 20 minutes, 2-3 times per day.   Activity  Rest as told by your health care provider.  Avoid activities that cause pain. These include any activities that use your chest muscles or your abdominal and side muscles to lift heavy items. Ask your health care provider what activities are safe for you. General instructions  Take over-the-counter and prescription medicines only as told by your health care provider.  Do not use any products that contain nicotine or tobacco, such as cigarettes, e-cigarettes, and chewing tobacco. These can delay healing after injury. If you need help quitting, ask your health care provider.  Keep all follow-up visits as told by your health care provider. This is important.   Contact a health care provider if:  You have a fever.  Your chest pain becomes worse.  You have new symptoms. Get help right away if:  You have nausea or vomiting.  You feel sweaty or light-headed.  You have a cough with mucus from your lungs (sputum) or you cough up blood.  You develop shortness of breath. These symptoms may represent a serious problem that is an emergency. Do not wait to see if the symptoms will go away. Get medical help right away. Call your local emergency services (911 in the [place] Do not drive yourself to the hospital. Summary  Chest wall pain is pain in or around the bones and muscles of your  chest.  Depending on the cause, it may be treated with ice, rest, medicines, and avoiding activities that cause pain.  Contact a health care provider if you have a fever, worsening chest pain, or new symptoms.  Get help right away if you feel light-headed or you develop shortness of breath. These symptoms may be an emergency. This information is not intended to replace advice given to you by your health care provider. Make sure you discuss any questions you have with your health care provider. Document Revised: 08/20/2017 Document Reviewed: 08/20/2017 Elsevier Patient Education  2021 [employer].

## 2020-07-23 NOTE — Progress Notes (Signed)
BP 136/82   Pulse 79   Temp 98.3 F (36.8 C) (Oral)   Wt 187 lb (84.8 kg)   LMP  (LMP Unknown)   SpO2 99%   BMI 31.90 kg/m    Subjective:    Patient ID: Carrie Arroyo, female    DOB: December 09, 1960, 60 y.o.   MRN: [id number]  HPI: Carrie Arroyo is a 60 y.o. female  Chief Complaint  Patient presents with  . Fall    Patient states she had a fall yesterday, states her dog pulled her down a ramp. Patient states she is having pain when inhaling, coughing and turn certain ways. Patient states the pain comes and goes. When she experiences the pain she says it is about a level 8. Patient states she has tried Advil for any pain and discomfort.     FALL She fell yesterday afternoon -- her dog pulled her down a show ramp.  Patient rolled, pulled, twist.  When fell ended up on left side.  Did finish the show with her dog.  Left chest area sore today.  Left arm some tingling in lower arm.  No shortness of breath.   Duration: days Mechanism of injury: fall Location: left side, mainly chest Onset: sudden Severity: 4/10 at best and 8/10 at worst Quality: throbbing with coughing Frequency: intermittent Radiation: none Aggravating factors: inhaling, coughing and turning certain ways Alleviating factors: Advil Status: fluctuating Treatments attempted: ibuprofen  Relief with NSAIDs?: moderate Nighttime pain:  no Paresthesias / decreased sensation:  no  Relevant past medical, surgical, family and social history reviewed and updated as indicated. Interim medical history since our last visit reviewed. Allergies and medications reviewed and updated.  Review of Systems  Constitutional: Negative for activity change, appetite change, diaphoresis, fatigue and fever.  Respiratory: Negative for cough, chest tightness and shortness of breath.   Cardiovascular: Negative for chest pain, palpitations and leg swelling.  Gastrointestinal: Negative.   Musculoskeletal: Positive for arthralgias.   Neurological: Negative.   Psychiatric/Behavioral: Negative.     Per HPI unless specifically indicated above     Objective:    BP 136/82   Pulse 79   Temp 98.3 F (36.8 C) (Oral)   Wt 187 lb (84.8 kg)   LMP  (LMP Unknown)   SpO2 99%   BMI 31.90 kg/m   Wt Readings from Last 3 Encounters:  07/23/20 187 lb (84.8 kg)  07/12/20 187 lb 9.6 oz (85.1 kg)  01/13/20 191 lb 3.2 oz (86.7 kg)    Physical Exam Vitals and nursing note reviewed.  Constitutional:      General: She is awake. She is not in acute distress.    Appearance: She is well-developed and overweight. She is not ill-appearing or toxic-appearing.  HENT:     Head: Normocephalic.     Right Ear: Hearing normal.     Left Ear: Hearing normal.  Eyes:     General: Lids are normal.        Right eye: No discharge.        Left eye: No discharge.     Conjunctiva/sclera: Conjunctivae normal.     Pupils: Pupils are equal, round, and reactive to light.  Neck:     Thyroid: No thyromegaly.     Vascular: No carotid bruit.  Cardiovascular:     Rate and Rhythm: Normal rate and regular rhythm.     Heart sounds: Normal heart sounds. No murmur heard. No gallop.   Pulmonary:  Effort: Pulmonary effort is normal. No accessory muscle usage or respiratory distress.     Breath sounds: Normal breath sounds.     Comments: No bruising to left side of chest. Tenderness on palpation to ribs under left breast and laterally + to upper left chest.  No crepitus noted.  Tenderness noted with deep breathing and coughing. Abdominal:     General: Bowel sounds are normal.     Palpations: Abdomen is soft. There is no hepatomegaly or splenomegaly.  Musculoskeletal:     Right shoulder: Normal.     Left shoulder: Tenderness (anterior aspect) present. No swelling, effusion, laceration, bony tenderness or crepitus. Normal range of motion. Normal strength.     Cervical back: Normal range of motion and neck supple.     Right lower leg: No edema.      Left lower leg: No edema.  Skin:    General: Skin is warm and dry.  Neurological:     Mental Status: She is alert and oriented to person, place, and time.  Psychiatric:        Attention and Perception: Attention normal.        Mood and Affect: Mood normal.        Behavior: Behavior normal. Behavior is cooperative.        Thought Content: Thought content normal.        Judgment: Judgment normal.    Results for orders placed or performed in visit on 07/12/20  Comprehensive metabolic panel  Result Value Ref Range   Glucose 97 65 - 99 mg/dL   BUN 10 6 - 24 mg/dL   Creatinine, Ser 0.63 0.57 - 1.00 mg/dL   eGFR 102 >59 mL/min/1.73   BUN/Creatinine Ratio 16 9 - 23   Sodium 141 134 - 144 mmol/L   Potassium 4.3 3.5 - 5.2 mmol/L   Chloride 98 96 - 106 mmol/L   CO2 25 20 - 29 mmol/L   Calcium 10.4 (H) 8.7 - 10.2 mg/dL   Total Protein 7.8 6.0 - 8.5 g/dL   Albumin 5.1 (H) 3.8 - 4.9 g/dL   Globulin, Total 2.7 1.5 - 4.5 g/dL   Albumin/Globulin Ratio 1.9 1.2 - 2.2   Bilirubin Total 0.2 0.0 - 1.2 mg/dL   Alkaline Phosphatase 105 44 - 121 IU/L   AST 24 0 - 40 IU/L   ALT 28 0 - 32 IU/L  Lipid Panel w/o Chol/HDL Ratio  Result Value Ref Range   Cholesterol, Total 172 100 - 199 mg/dL   Triglycerides 161 (H) 0 - 149 mg/dL   HDL 49 >39 mg/dL   VLDL Cholesterol Cal 28 5 - 40 mg/dL   LDL Chol Calc (NIH) 95 0 - 99 mg/dL  TSH  Result Value Ref Range   TSH 1.140 0.450 - 4.500 uIU/mL  Hemoglobin A1c  Result Value Ref Range   Hgb A1c MFr Bld 5.9 (H) 4.8 - 5.6 %   Est. average glucose Bld gHb Est-mCnc 123 mg/dL  Microalbumin, Urine Waived (STAT)  Result Value Ref Range   Microalb, Ur Waived 10 0 - 19 mg/L   Creatinine, Urine Waived 50 10 - 300 mg/dL   Microalb/Creat Ratio <30 <30 mg/g      Assessment & Plan:   Problem List Items Addressed This Visit      Other   Acute pain of left shoulder    Acute, post fall one day ago.  Will obtain imaging, low suspicion for fracture to shoulder, but  is having  some pain.  Recommend alternating ice and heat at home + resting shoulder.  Will send in short burst of Norco to be used as needed only for severe pain.  If fracture present will send to ortho.  Return in one week for follow-up.        Relevant Orders   DG Shoulder Left   Rib pain on right side - Primary    Acute, post fall one day ago.  Will obtain imaging to ensure no fracture as is tender to rib area, no crepitus noted.  Recommend she ensure to perform deep breathing exercises at least every 4 hours and educated on how to perform to avoid PNA.  Short burst of Norco provided to use as needed for severe pain only, recommend to try using Tylenol as needed for mild to moderate pain -- max 3000 MG total a day of Acetaminophen between Norco and Tylenol.  Alternated heat and ice to area.  Rest.  Return to office in one week.      Relevant Orders   DG Ribs Unilateral Left   Fall    Refer to shoulder and rib pain plan of care.          Follow up plan: Return in about 1 week (around 07/30/2020) for Rib pain.

## 2020-07-25 NOTE — Progress Notes (Signed)
Contacted via MyChart   Good morning Carrie Arroyo, your imaging has returned, no acute rib fractures or shoulder fracture noted.  Good news. Suspect more muscle bruising then anything.  I would recommend continuing current plan of care and taking pain medication as needed + utilizing heat.  Ensure to continue deep breathing exercises. Any questions? Keep being awesome!!  Thank you for allowing me to participate in your care.  I appreciate you. Kindest regards, Kaarin Pardy

## 2020-07-26 DIAGNOSIS — G4733 Obstructive sleep apnea (adult) (pediatric): Secondary | ICD-10-CM | POA: Diagnosis not present

## 2020-08-03 ENCOUNTER — Ambulatory Visit: Payer: BC Managed Care – PPO | Admitting: Nurse Practitioner

## 2020-08-03 ENCOUNTER — Encounter: Payer: Self-pay | Admitting: Nurse Practitioner

## 2020-08-03 ENCOUNTER — Other Ambulatory Visit: Payer: Self-pay

## 2020-08-03 DIAGNOSIS — R0781 Pleurodynia: Secondary | ICD-10-CM | POA: Diagnosis not present

## 2020-08-03 NOTE — Patient Instructions (Signed)

## 2020-08-03 NOTE — Progress Notes (Signed)
BP 121/80   Pulse 85   Temp 98.9 F (37.2 C) (Oral)   Wt 186 lb 9.6 oz (84.6 kg)   LMP  (LMP Unknown)   SpO2 97%   BMI 31.83 kg/m    Subjective:    Patient ID: Carrie Arroyo, female    DOB: Jan 22, 1961, 60 y.o.   MRN: [id number]  HPI: KHAMARI YOUSUF is a 60 y.o. female  Chief Complaint  Patient presents with  . Chest Pain    Patient states the rib pain isn't 100% gone, but it has gotten better.    FALL Follow-up for recent injuries to ribs. No fractures on imaging to left ribs and left shoulder.  She fell one week ago -- her dog pulled her down a show ramp.  Patient rolled, pulled, twist.  When fell ended up on left side.  No shortness of breath.   Duration: days Mechanism of injury: fall Location: left side under breast Onset: sudden Severity: 1/10 improved, more sore Quality: sore now -- no major pain Frequency: intermittent Radiation: none Aggravating factors: inhaling, coughing and turning certain ways Alleviating factors: Advil and Tylenol Status: improved Treatments attempted: ibuprofen and Tylenol Relief with NSAIDs?: moderate Nighttime pain:  no Paresthesias / decreased sensation:  no  Relevant past medical, surgical, family and social history reviewed and updated as indicated. Interim medical history since our last visit reviewed. Allergies and medications reviewed and updated.  Review of Systems  Constitutional: Negative for activity change, appetite change, diaphoresis, fatigue and fever.  Respiratory: Negative for cough, chest tightness and shortness of breath.   Cardiovascular: Negative for chest pain, palpitations and leg swelling.  Gastrointestinal: Negative.   Musculoskeletal: Positive for arthralgias.  Neurological: Negative.   Psychiatric/Behavioral: Negative.     Per HPI unless specifically indicated above     Objective:    BP 121/80   Pulse 85   Temp 98.9 F (37.2 C) (Oral)   Wt 186 lb 9.6 oz (84.6 kg)   LMP  (LMP Unknown)   SpO2  97%   BMI 31.83 kg/m   Wt Readings from Last 3 Encounters:  08/03/20 186 lb 9.6 oz (84.6 kg)  07/23/20 187 lb (84.8 kg)  07/12/20 187 lb 9.6 oz (85.1 kg)    Physical Exam Vitals and nursing note reviewed.  Constitutional:      General: She is awake. She is not in acute distress.    Appearance: She is well-developed and overweight. She is not ill-appearing or toxic-appearing.  HENT:     Head: Normocephalic.     Right Ear: Hearing normal.     Left Ear: Hearing normal.  Eyes:     General: Lids are normal.        Right eye: No discharge.        Left eye: No discharge.     Conjunctiva/sclera: Conjunctivae normal.     Pupils: Pupils are equal, round, and reactive to light.  Neck:     Thyroid: No thyromegaly.     Vascular: No carotid bruit.  Cardiovascular:     Rate and Rhythm: Normal rate and regular rhythm.     Heart sounds: Normal heart sounds. No murmur heard. No gallop.   Pulmonary:     Effort: Pulmonary effort is normal. No accessory muscle usage or respiratory distress.     Breath sounds: Normal breath sounds.     Comments: No bruising to left side of chest. Mild tenderness on palpation to ribs under left breast and  laterally. No crepitus noted.   Abdominal:     General: Bowel sounds are normal.     Palpations: Abdomen is soft. There is no hepatomegaly or splenomegaly.  Musculoskeletal:     Right shoulder: Normal.     Left shoulder: Tenderness (anterior aspect) present. No swelling, effusion, laceration, bony tenderness or crepitus. Normal range of motion. Normal strength.     Cervical back: Normal range of motion and neck supple.     Right lower leg: No edema.     Left lower leg: No edema.  Skin:    General: Skin is warm and dry.  Neurological:     Mental Status: She is alert and oriented to person, place, and time.  Psychiatric:        Attention and Perception: Attention normal.        Mood and Affect: Mood normal.        Behavior: Behavior normal. Behavior is  cooperative.        Thought Content: Thought content normal.        Judgment: Judgment normal.    Results for orders placed or performed in visit on 07/12/20  Comprehensive metabolic panel  Result Value Ref Range   Glucose 97 65 - 99 mg/dL   BUN 10 6 - 24 mg/dL   Creatinine, Ser 0.63 0.57 - 1.00 mg/dL   eGFR 102 >59 mL/min/1.73   BUN/Creatinine Ratio 16 9 - 23   Sodium 141 134 - 144 mmol/L   Potassium 4.3 3.5 - 5.2 mmol/L   Chloride 98 96 - 106 mmol/L   CO2 25 20 - 29 mmol/L   Calcium 10.4 (H) 8.7 - 10.2 mg/dL   Total Protein 7.8 6.0 - 8.5 g/dL   Albumin 5.1 (H) 3.8 - 4.9 g/dL   Globulin, Total 2.7 1.5 - 4.5 g/dL   Albumin/Globulin Ratio 1.9 1.2 - 2.2   Bilirubin Total 0.2 0.0 - 1.2 mg/dL   Alkaline Phosphatase 105 44 - 121 IU/L   AST 24 0 - 40 IU/L   ALT 28 0 - 32 IU/L  Lipid Panel w/o Chol/HDL Ratio  Result Value Ref Range   Cholesterol, Total 172 100 - 199 mg/dL   Triglycerides 161 (H) 0 - 149 mg/dL   HDL 49 >39 mg/dL   VLDL Cholesterol Cal 28 5 - 40 mg/dL   LDL Chol Calc (NIH) 95 0 - 99 mg/dL  TSH  Result Value Ref Range   TSH 1.140 0.450 - 4.500 uIU/mL  Hemoglobin A1c  Result Value Ref Range   Hgb A1c MFr Bld 5.9 (H) 4.8 - 5.6 %   Est. average glucose Bld gHb Est-mCnc 123 mg/dL  Microalbumin, Urine Waived (STAT)  Result Value Ref Range   Microalb, Ur Waived 10 0 - 19 mg/L   Creatinine, Urine Waived 50 10 - 300 mg/dL   Microalb/Creat Ratio <30 <30 mg/g      Assessment & Plan:   Problem List Items Addressed This Visit      Other   Rib pain on left side    Overall improving with no fractures noted on imaging.  Recommend she ensure to continue to perform deep breathing exercises at least every 4 hours and educated on how to perform to avoid PNA.  Recommend to continue to use Tylenol as needed for mild to moderate pain -- max 3000 MG total a day of Acetaminophen between Norco and Tylenol + apply Icy/Hot lidocaine patches to area when driving long distance.   Alternate  heat and ice to area.  Rest.  Return to office as needed if return of worsening pain.          Follow up plan: Return if symptoms worsen or fail to improve.

## 2020-08-03 NOTE — Assessment & Plan Note (Signed)
Overall improving with no fractures noted on imaging.  Recommend she ensure to continue to perform deep breathing exercises at least every 4 hours and educated on how to perform to avoid PNA.  Recommend to continue to use Tylenol as needed for mild to moderate pain -- max 3000 MG total a day of Acetaminophen between Norco and Tylenol + apply Icy/Hot lidocaine patches to area when driving long distance.  Alternate heat and ice to area.  Rest.  Return to office as needed if return of worsening pain.

## 2020-10-20 ENCOUNTER — Emergency Department
Admission: EM | Admit: 2020-10-20 | Discharge: 2020-10-20 | Disposition: A | Discharge status: Home / Self Care | Payer: BC Managed Care – PPO | Attending: Emergency Medicine | Admitting: Emergency Medicine

## 2020-10-20 ENCOUNTER — Other Ambulatory Visit: Payer: Self-pay

## 2020-10-20 DIAGNOSIS — S51851A Open bite of right forearm, initial encounter: Secondary | ICD-10-CM | POA: Diagnosis not present

## 2020-10-20 DIAGNOSIS — Y92838 Other recreation area as the place of occurrence of the external cause: Secondary | ICD-10-CM | POA: Diagnosis not present

## 2020-10-20 DIAGNOSIS — Z7982 Long term (current) use of aspirin: Secondary | ICD-10-CM | POA: Diagnosis not present

## 2020-10-20 DIAGNOSIS — I10 Essential (primary) hypertension: Secondary | ICD-10-CM | POA: Insufficient documentation

## 2020-10-20 DIAGNOSIS — Z79899 Other long term (current) drug therapy: Secondary | ICD-10-CM | POA: Diagnosis not present

## 2020-10-20 DIAGNOSIS — W540XXA Bitten by dog, initial encounter: Secondary | ICD-10-CM | POA: Diagnosis not present

## 2020-10-20 MED ORDER — METRONIDAZOLE 500 MG PO TABS: 500.0000 mg | ORAL_TABLET | Freq: Three times a day (TID) | ORAL | 0 refills | Status: AC

## 2020-10-20 MED ORDER — DOXYCYCLINE HYCLATE 100 MG PO TABS: 100.0000 mg | ORAL_TABLET | Freq: Two times a day (BID) | ORAL | 0 refills | Status: DC

## 2020-10-20 MED ORDER — METRONIDAZOLE 500 MG PO TABS
500.0000 mg | ORAL_TABLET | Freq: Once | ORAL | Status: AC
  Administered 2020-10-20: 500 mg via ORAL
  Filled 2020-10-20: qty 1

## 2020-10-20 MED ORDER — DOXYCYCLINE HYCLATE 100 MG PO TABS
100.0000 mg | ORAL_TABLET | Freq: Once | ORAL | Status: AC
  Administered 2020-10-20: 100 mg via ORAL
  Filled 2020-10-20: qty 1

## 2020-10-20 NOTE — ED Provider Notes (Signed)
[facility] ([place]) Emergency Department Provider Note ____________________________________________  Time seen: 79  I have reviewed the triage vital signs and the nursing notes.  HISTORY  Chief Complaint  Animal Bite   HPI Carrie Arroyo is a 60 y.o. female with below medical history, presents to the ED for evaluation of superficial dog bite to the right forearm.  Patient was at a dog agility trials today, when she had a dog chased to a position under her right armpit.  She walked past a [place] malinois, and the dog saw the toy, and lunged at.  The patient yelled at her arm, and the malinois bit her on the arm.  She notes a current tetanus status.  She denies any other injury at this time.  Past Medical History:  Diagnosis Date   Allergy    Boils    Elevated liver enzymes    Humerus fracture    right   Hypertension    Sleep apnea     Patient Active Problem List   Diagnosis Date Noted   Rib pain on left side 07/23/2020   Fall 07/23/2020   OSA on CPAP 04/11/2020   Insomnia 05/24/2019   Low TSH level 11/16/2018   GERD (gastroesophageal reflux disease) 04/04/2018   Hypertension 08/21/2014   Vitamin D deficiency 08/21/2014   Allergic rhinitis 08/21/2014   Prediabetes 08/21/2014   Hyperlipidemia 08/21/2014   Obesity 08/21/2014    Past Surgical History:  Procedure Laterality Date   ABDOMINAL HYSTERECTOMY     APPENDECTOMY     DENTAL SURGERY  01/12/2020   OVARIAN CYST REMOVAL     PAROTIDECTOMY      Prior to Admission medications   Medication Sig Start Date End Date Taking? Authorizing Provider  doxycycline (VIBRA-TABS) 100 MG tablet Take 1 tablet (100 mg total) by mouth 2 (two) times daily. 10/20/20  Yes Richa Shor, Charlesetta Ivory, PA-C  metroNIDAZOLE (FLAGYL) 500 MG tablet Take 1 tablet (500 mg total) by mouth 3 (three) times daily for 7 days. 10/20/20 10/27/20 Yes Ricci Paff, Charlesetta Ivory, PA-C  aspirin 81 MG tablet Take 81 mg by mouth daily.    [provider]  benazepril (LOTENSIN) 20 MG tablet Take 1 tablet (20 mg total) by mouth daily. 01/13/20   Cannady, Corrie Dandy T, NP  Cholecalciferol (VITAMIN D3) 50 MCG (2000 UT) CAPS Take 1 capsule by mouth daily.    [provider]  clindamycin (CLEOCIN T) 1 % lotion Apply topically. 03/09/20   [provider]  clobetasol cream (TEMOVATE) 0.05 % as needed. 07/30/15   [provider]  fluticasone (FLONASE) 50 MCG/ACT nasal spray Place 2 sprays into both nostrils daily.    [provider]  loratadine (CLARITIN) 10 MG tablet Take 10 mg by mouth daily.    [provider]  minocycline (MINOCIN,DYNACIN) 100 MG capsule TAKE ONE CAPSULE BY MOUTH TWICE A DAY FOR FLARES 06/13/14   [provider]  Multiple Vitamin (MULTIVITAMIN) tablet Take 1 tablet by mouth daily.    [provider]  omeprazole (PRILOSEC) 20 MG capsule Take 20 mg by mouth daily.    [provider]  simvastatin (ZOCOR) 40 MG tablet Take 1 tablet (40 mg total) by mouth at bedtime. 01/16/20   Aura Dials T, NP    Allergies Penicillin g benzathine and Sulfa antibiotics  Family History  Adopted: Yes  Family history unknown: Yes    Social History Social History   Tobacco Use   Smoking status: Never  Smokeless tobacco: Never  Vaping Use   Vaping Use: Never used  Substance Use Topics   Alcohol use: Yes    Alcohol/week: 2.0 standard drinks    Types: 2 Glasses of wine per week   Drug use: No    Review of Systems  Constitutional: Negative for fever. Cardiovascular: Negative for chest pain. Respiratory: Negative for shortness of breath. Gastrointestinal: Negative for abdominal pain, vomiting and diarrhea. Genitourinary: Negative for dysuria. Musculoskeletal: Negative for back pain. Skin: Negative for rash.  Dog bite to the right forearm as above. Neurological: Negative for headaches, focal weakness or  numbness. ____________________________________________  PHYSICAL EXAM:  VITAL SIGNS: ED Triage Vitals  Enc Vitals Group     BP 10/20/20 1656 (!) 184/96     Pulse Rate 10/20/20 1654 97     Resp 10/20/20 1654 18     Temp 10/20/20 1656 98.7 F (37.1 C)     Temp Source 10/20/20 1656 Oral     SpO2 10/20/20 1654 100 %     Weight --      Height --      Head Circumference --      Peak Flow --      Pain Score 10/20/20 1656 2     Pain Loc --      Pain Edu? --      Excl. in GC? --     Constitutional: Alert and oriented. Well appearing and in no distress. Head: Normocephalic and atraumatic. Eyes: Conjunctivae are normal. Normal extraocular movements Cardiovascular: Normal rate, regular rhythm. Normal distal pulses. Respiratory: Normal respiratory effort.  Musculoskeletal: Right forearm with superficial wound consistent with a dog bite.  Normal composite fist distally.  Nontender with normal range of motion in all extremities.  Neurologic:  Normal sensation.  Normal intrinsic and opposition testing noted.  Normal speech and language. No gross focal neurologic deficits are appreciated. Skin:  Skin is warm, dry and intact. No rash noted. Psychiatric: Mood and affect are normal. Patient exhibits appropriate insight and judgment. ____________________________________________    {LABS (pertinent positives/negatives)  ____________________________________________  {EKG  ____________________________________________   RADIOLOGY Official radiology report(s): No results found. ____________________________________________  PROCEDURES  Doxycyline 100 mg PO Metronidazole 500 mg PO Wound care  Procedures ____________________________________________   INITIAL IMPRESSION / ASSESSMENT AND PLAN / ED COURSE  As part of my medical decision making, I reviewed the following data within the electronic MEDICAL RECORD NUMBER Notes from prior ED visits    Patient with ED evaluation of t an  unintentional dog bite to the right forearm.  She presents with superficial wound at this time.  No stability or deformities appreciated.  Patient will be treated empirically with doxycycline and metronidazole, given her penicillin allergy.  Wound is cleansed appropriately, and dressed with nonstick dressing.  Patient is discharged wound care instructions and supplies.  She continue with antibiotic regimen, follow-up with primary provider for ongoing symptoms.  Return precautions of been reviewed.    Carrie Arroyo was evaluated in Emergency Department on 10/20/2020 for the symptoms described in the history of present illness. She was evaluated in the context of the global COVID-19 pandemic, which necessitated consideration that the patient might be at risk for infection with the SARS-CoV-2 virus that causes COVID-19. Institutional protocols and algorithms that pertain to the evaluation of patients at risk for COVID-19 are in a state of rapid change based on information released by regulatory bodies including the CDC and federal and state organizations. These policies and algorithms  were followed during the patient's care in the ED. ____________________________________________  FINAL CLINICAL IMPRESSION(S) / ED DIAGNOSES  Final diagnoses:  Dog bite, initial encounter      Lissa Hoard, PA-C 10/20/20 1930    Gilles Chiquito, MD 10/20/20 [phone]

## 2020-10-20 NOTE — Discharge Instructions (Signed)
Keep the wound clean, dry, and covered. Take the antibiotics as directed. Follow-up with your provider for wound checks as needed.

## 2020-10-20 NOTE — ED Triage Notes (Signed)
Pt states she was at a dog watersport event and a dog lunged at a toy she was holding and bit her right forearm, pt's left forearm is wrapped by medics at event, bleeding is controled with dressing. Pt states animal control was not contacted at her request, dog had all of shots up to date per pt.

## 2020-10-30 ENCOUNTER — Other Ambulatory Visit: Payer: Self-pay

## 2020-10-30 ENCOUNTER — Encounter: Payer: Self-pay | Admitting: Nurse Practitioner

## 2020-10-30 ENCOUNTER — Ambulatory Visit: Payer: BC Managed Care – PPO | Admitting: Nurse Practitioner

## 2020-10-30 VITALS — BP 121/82 | HR 86 | Temp 99.0°F | Wt 186.0 lb

## 2020-10-30 DIAGNOSIS — S51801D Unspecified open wound of right forearm, subsequent encounter: Secondary | ICD-10-CM

## 2020-10-30 NOTE — Progress Notes (Signed)
Established Patient Office Visit  Subjective:  Patient ID: Carrie Arroyo, female    DOB: January 14, 1961  Age: 60 y.o. MRN: [id number]  CC:  Chief Complaint  Patient presents with   Animal Bite    Patient is here for follow up on a dog bite. Patient states she is having some tenderness in her right wrist (thumb) and states it is still bruised. Patient states she is almost finished with antibiotic.     HPI Carrie Arroyo presents for follow-up on a dog bite to her right forearm on 10/20/20. She was given doxycyline and metronidazole for antibiotic prophylaxis and has almost finished the course. She has been washing the wound with soap and water and apply OTC antibiotic ointment and then covering. There is some redness to the area, however it has improved. She is having some pain and soreness to her right thumb, however she has full range of motion. She denies fevers and swelling.   Past Medical History:  Diagnosis Date   Allergy    Boils    Elevated liver enzymes    Humerus fracture    right   Hypertension    Sleep apnea     Past Surgical History:  Procedure Laterality Date   ABDOMINAL HYSTERECTOMY     APPENDECTOMY     DENTAL SURGERY  01/12/2020   OVARIAN CYST REMOVAL     PAROTIDECTOMY      Family History  Adopted: Yes  Family history unknown: Yes    Social History   Socioeconomic History   Marital status: Married    Spouse name: Not on file   Number of children: Not on file   Years of education: Not on file   Highest education level: Not on file  Occupational History   Not on file  Tobacco Use   Smoking status: Never   Smokeless tobacco: Never  Vaping Use   Vaping Use: Never used  Substance and Sexual Activity   Alcohol use: Yes    Alcohol/week: 2.0 standard drinks    Types: 2 Glasses of wine per week   Drug use: No   Sexual activity: Never  Other Topics Concern   Not on file  Social History Narrative   Not on file   Social Determinants of Health    Financial Resource Strain: Not on file  Food Insecurity: Not on file  Transportation Needs: Not on file  Physical Activity: Not on file  Stress: Not on file  Social Connections: Not on file  Intimate Partner Violence: Not on file    Outpatient Medications Prior to Visit  Medication Sig Dispense Refill   aspirin 81 MG tablet Take 81 mg by mouth daily.     benazepril (LOTENSIN) 20 MG tablet Take 1 tablet (20 mg total) by mouth daily. 90 tablet 4   Cholecalciferol (VITAMIN D3) 50 MCG (2000 UT) CAPS Take 1 capsule by mouth daily.     clindamycin (CLEOCIN T) 1 % lotion Apply topically.     clobetasol cream (TEMOVATE) 0.05 % as needed.  0   doxycycline (VIBRA-TABS) 100 MG tablet Take 1 tablet (100 mg total) by mouth 2 (two) times daily. 20 tablet 0   fluticasone (FLONASE) 50 MCG/ACT nasal spray Place 2 sprays into both nostrils daily.     loratadine (CLARITIN) 10 MG tablet Take 10 mg by mouth daily.     minocycline (MINOCIN,DYNACIN) 100 MG capsule TAKE ONE CAPSULE BY MOUTH TWICE A DAY FOR FLARES  4  Multiple Vitamin (MULTIVITAMIN) tablet Take 1 tablet by mouth daily.     omeprazole (PRILOSEC) 20 MG capsule Take 20 mg by mouth daily.     simvastatin (ZOCOR) 40 MG tablet Take 1 tablet (40 mg total) by mouth at bedtime. 90 tablet 3   SODIUM FLUORIDE 5000 PPM 1.1 % PSTE Take by mouth at bedtime.     No facility-administered medications prior to visit.    Allergies  Allergen Reactions   Penicillin G Benzathine    Sulfa Antibiotics     ROS Review of Systems  Constitutional:  Negative for fatigue and fever.  Musculoskeletal:  Positive for arthralgias (right thumb).  Skin:  Positive for wound (right forearm).     Objective:    Physical Exam Vitals and nursing note reviewed.  Constitutional:      General: She is not in acute distress.    Appearance: Normal appearance.  HENT:     Head: Normocephalic and atraumatic.  Eyes:     Conjunctiva/sclera: Conjunctivae normal.  Neck:      Vascular: No carotid bruit.  Cardiovascular:     Rate and Rhythm: Normal rate.     Pulses: Normal pulses.  Pulmonary:     Effort: Pulmonary effort is normal.  Musculoskeletal:        General: Tenderness present. Normal range of motion.     Cervical back: Normal range of motion.  Skin:    General: Skin is warm and dry.     Findings: Bruising (fading to right forearm) present.     Comments: 2cm x 0.5cm wound to right forearm along with a puncture wound below. Wound is healing, no signs of infection or drainage. Puncture wound is slightly erythematous with no drainage.   Neurological:     General: No focal deficit present.     Mental Status: She is alert and oriented to person, place, and time.  Psychiatric:        Mood and Affect: Mood normal.        Behavior: Behavior normal.        Thought Content: Thought content normal.        Judgment: Judgment normal.    BP 121/82   Pulse 86   Temp 99 F (37.2 C) (Oral)   Wt 186 lb (84.4 kg)   LMP  (LMP Unknown)   SpO2 98%   BMI 31.73 kg/m  Wt Readings from Last 3 Encounters:  10/30/20 186 lb (84.4 kg)  08/03/20 186 lb 9.6 oz (84.6 kg)  07/23/20 187 lb (84.8 kg)     Health Maintenance Due  Topic Date Due   Zoster Vaccines- Shingrix (1 of 2) Never done   COVID-19 Vaccine (3 - Booster) 03/14/2020   INFLUENZA VACCINE  10/01/2020    There are no preventive care reminders to display for this patient.  Lab Results  Component Value Date   TSH 1.140 07/12/2020   Lab Results  Component Value Date   WBC 12.8 (H) 01/13/2020   HGB 15.0 01/13/2020   HCT 45.0 01/13/2020   MCV 95 01/13/2020   PLT 354 01/13/2020   Lab Results  Component Value Date   NA 141 07/12/2020   K 4.3 07/12/2020   CO2 25 07/12/2020   GLUCOSE 97 07/12/2020   BUN 10 07/12/2020   CREATININE 0.63 07/12/2020   BILITOT 0.2 07/12/2020   ALKPHOS 105 07/12/2020   AST 24 07/12/2020   ALT 28 07/12/2020   PROT 7.8 07/12/2020   ALBUMIN 5.1 (H)  07/12/2020    CALCIUM 10.4 (H) 07/12/2020   ANIONGAP 10 04/28/2015   EGFR 102 07/12/2020   Lab Results  Component Value Date   CHOL 172 07/12/2020   Lab Results  Component Value Date   HDL 49 07/12/2020   Lab Results  Component Value Date   LDLCALC 95 07/12/2020   Lab Results  Component Value Date   TRIG 161 (H) 07/12/2020   No results found for: CHOLHDL Lab Results  Component Value Date   HGBA1C 5.9 (H) 07/12/2020      Assessment & Plan:   Problem List Items Addressed This Visit   None Visit Diagnoses     Open wound of right forearm, subsequent encounter    -  Primary   No signs of infection. Finish antibiotics and continue washing with soap and water and applying neosporin. Discussed return/ER precautions.        No orders of the defined types were placed in this encounter.   Follow-up: Return if symptoms worsen or fail to improve.    Charyl Dancer, NP

## 2020-11-12 DIAGNOSIS — G4733 Obstructive sleep apnea (adult) (pediatric): Secondary | ICD-10-CM | POA: Diagnosis not present

## 2020-11-13 DIAGNOSIS — G4733 Obstructive sleep apnea (adult) (pediatric): Secondary | ICD-10-CM | POA: Diagnosis not present

## 2020-11-20 ENCOUNTER — Encounter: Payer: Self-pay | Admitting: Nurse Practitioner

## 2020-11-20 ENCOUNTER — Other Ambulatory Visit: Payer: Self-pay

## 2020-11-20 ENCOUNTER — Ambulatory Visit (INDEPENDENT_AMBULATORY_CARE_PROVIDER_SITE_OTHER): Payer: BC Managed Care – PPO | Admitting: Nurse Practitioner

## 2020-11-20 DIAGNOSIS — L03113 Cellulitis of right upper limb: Secondary | ICD-10-CM | POA: Diagnosis not present

## 2020-11-20 DIAGNOSIS — L0390 Cellulitis, unspecified: Secondary | ICD-10-CM | POA: Insufficient documentation

## 2020-11-20 MED ORDER — MUPIROCIN 2 % EX OINT: 1.0000 "application " | TOPICAL_OINTMENT | Freq: Every day | CUTANEOUS | 0 refills | Status: DC

## 2020-11-20 MED ORDER — SANTYL 250 UNIT/GM EX OINT: 1.0000 "application " | TOPICAL_OINTMENT | Freq: Every day | CUTANEOUS | 0 refills | Status: DC

## 2020-11-20 NOTE — Patient Instructions (Signed)
Wound Care, Adult Taking care of your wound properly can help to prevent pain, infection, and scarring. It can also help your wound heal more quickly. Follow instructionsfrom your health care provider about how to care for your wound. Supplies needed: Soap and water. Wound cleanser. Gauze. If needed, a clean bandage (dressing) or other type of wound dressing material to cover or place in the wound. Follow your health care provider's instructions about what dressing supplies to use. Cream or ointment to apply to the wound, if told by your health care provider. How to care for your wound Cleaning the wound Ask your health care provider how to clean the wound. This may include: Using mild soap and water or a wound cleanser. Using a clean gauze to pat the wound dry after cleaning it. Do not rub or scrub the wound. Dressing care Wash your hands with soap and water for at least 20 seconds before and after you change the dressing. If soap and water are not available, use hand sanitizer. Change your dressing as told by your health care provider. This may include: Cleaning or rinsing out (irrigating) the wound. Placing a dressing over the wound or in the wound (packing). Covering the wound with an outer dressing. Leave any stitches (sutures), skin glue, or adhesive strips in place. These skin closures may need to stay in place for 2 weeks or longer. If adhesive strip edges start to loosen and curl up, you may trim the loose edges. Do not remove adhesive strips completely unless your health care provider tells you to do that. Ask your health care provider when you can leave the wound uncovered. Checking for infection Check your wound area every day for signs of infection. Check for: More redness, swelling, or pain. Fluid or blood. Warmth. Pus or a bad smell.  Follow these instructions at home Medicines If you were prescribed an antibiotic medicine, cream, or ointment, take or apply it as told by  your health care provider. Do not stop using the antibiotic even if your condition improves. If you were prescribed pain medicine, take it 30 minutes before you do any wound care or as told by your health care provider. Take over-the-counter and prescription medicines only as told by your health care provider. Eating and drinking Eat a diet that includes protein, vitamin A, vitamin C, and other nutrient-rich foods to help the wound heal. Foods rich in protein include meat, fish, eggs, dairy, beans, and nuts. Foods rich in vitamin A include carrots and dark green, leafy vegetables. Foods rich in vitamin C include citrus fruits, tomatoes, broccoli, and peppers. Drink enough fluid to keep your urine pale yellow. General instructions Do not take baths, swim, use a hot tub, or do anything that would put the wound underwater until your health care provider approves. Ask your health care provider if you may take showers. You may only be allowed to take sponge baths. Do not scratch or pick at the wound. Keep it covered as told by your health care provider. Return to your normal activities as told by your health care provider. Ask your health care provider what activities are safe for you. Protect your wound from the sun when you are outside for the first 6 months, or for as long as told by your health care provider. Cover up the scar area or apply sunscreen that has an SPF of at least 30. Do not use any products that contain nicotine or tobacco, such as cigarettes, e-cigarettes, and chewing tobacco.   These may delay wound healing. If you need help quitting, ask your health care provider. Keep all follow-up visits as told by your health care provider. This is important. Contact a health care provider if: You received a tetanus shot and you have swelling, severe pain, redness, or bleeding at the injection site. Your pain is not controlled with medicine. You have any of these signs of infection: More  redness, swelling, or pain around the wound. Fluid or blood coming from the wound. Warmth coming from the wound. Pus or a bad smell coming from the wound. A fever or chills. You are nauseous or you vomit. You are dizzy. Get help right away if: You have a red streak of skin near the area around your wound. Your wound has been closed with staples, sutures, skin glue, or adhesive strips and it begins to open up and separate. Your wound is bleeding, and the bleeding does not stop with gentle pressure. You have a rash. You faint. You have trouble breathing. These symptoms may represent a serious problem that is an emergency. Do not wait to see if the symptoms will go away. Get medical help right away. Call your local emergency services (911 in the [place] Do not drive yourself to the hospital. Summary Always wash your hands with soap and water for at least 20 seconds before and after changing your dressing. Change your dressing as told by your health care provider. To help with healing, eat foods that are rich in protein, vitamin A, vitamin C, and other nutrients. Check your wound every day for signs of infection. Contact your health care provider if you suspect that your wound is infected. This information is not intended to replace advice given to you by your health care provider. Make sure you discuss any questions you have with your healthcare provider. Document Revised: 12/03/2018 Document Reviewed: 12/03/2018 [employer]vier Patient Education  2022 [employer]  

## 2020-11-20 NOTE — Progress Notes (Signed)
BP 122/85   Pulse 96   Temp 98.6 F (37 C) (Oral)   Wt 188 lb 3.2 oz (85.4 kg)   LMP  (LMP Unknown)   SpO2 98%   BMI 32.10 kg/m    Subjective:    Patient ID: Carrie Arroyo, female    DOB: 1960-05-13, 60 y.o.   MRN: [id number]  HPI: Carrie Arroyo is a 60 y.o. female  Chief Complaint  Patient presents with   Wound Check    Patient is here for a follow up dog bite. Patient states the wound isn't getting any better. Patient complains of the area still being red, sore, and draining pus from the area of concern. Patient states the pus is white and has a bad odor to it. Patient states the wound still has a hole and would think that after a month it would be better.   SKIN INFECTION Follow-up for dog bite -- initially seen in ER on 10/20/20.  Was treated with Doxycycline and Flagyl -- completed full course.  Had follow-up in office on 10/30/20.  At this time wound remains open and tender to touch.  Is draining pus at times + has odor.  Has been washing the wound daily with soap and water + placing ointment on and keeping covered. Duration: months Location: right forearm History of trauma in area: yes Pain: yes if directly touch area or hit it Quality: yes Severity: 8/10 Redness: yes Swelling: no Oozing: yes Pus: yes Fevers: no Nausea/vomiting: no Status: fluctuating Treatments attempted:antibiotics and warm compresses  Tetanus: UTD   Relevant past medical, surgical, family and social history reviewed and updated as indicated. Interim medical history since our last visit reviewed. Allergies and medications reviewed and updated.  Review of Systems  Constitutional:  Negative for activity change, appetite change, diaphoresis, fatigue and fever.  Respiratory:  Negative for cough, chest tightness and shortness of breath.   Cardiovascular:  Negative for chest pain, palpitations and leg swelling.  Skin:  Positive for wound.  Psychiatric/Behavioral: Negative.     Per HPI unless  specifically indicated above     Objective:    BP 122/85   Pulse 96   Temp 98.6 F (37 C) (Oral)   Wt 188 lb 3.2 oz (85.4 kg)   LMP  (LMP Unknown)   SpO2 98%   BMI 32.10 kg/m   Wt Readings from Last 3 Encounters:  11/20/20 188 lb 3.2 oz (85.4 kg)  10/30/20 186 lb (84.4 kg)  08/03/20 186 lb 9.6 oz (84.6 kg)    Physical Exam Vitals and nursing note reviewed.  Constitutional:      General: She is awake. She is not in acute distress.    Appearance: She is well-developed and overweight. She is not ill-appearing.  HENT:     Head: Normocephalic.     Right Ear: Hearing normal.     Left Ear: Hearing normal.  Eyes:     General: Lids are normal.        Right eye: No discharge.        Left eye: No discharge.     Conjunctiva/sclera: Conjunctivae normal.     Pupils: Pupils are equal, round, and reactive to light.  Neck:     Thyroid: No thyromegaly.     Vascular: No carotid bruit.  Cardiovascular:     Rate and Rhythm: Normal rate and regular rhythm.     Heart sounds: Normal heart sounds. No murmur heard.   No gallop.  Pulmonary:     Effort: Pulmonary effort is normal.     Breath sounds: Normal breath sounds.  Abdominal:     General: Bowel sounds are normal.     Palpations: Abdomen is soft. There is no hepatomegaly or splenomegaly.  Musculoskeletal:     Cervical back: Normal range of motion and neck supple.     Right lower leg: No edema.     Left lower leg: No edema.  Skin:    General: Skin is warm and dry.     Findings: Wound present.       Neurological:     Mental Status: She is alert and oriented to person, place, and time.  Psychiatric:        Attention and Perception: Attention normal.        Mood and Affect: Mood normal.        Behavior: Behavior normal. Behavior is cooperative.        Thought Content: Thought content normal.        Judgment: Judgment normal.    Results for orders placed or performed in visit on 07/12/20  Comprehensive metabolic panel  Result  Value Ref Range   Glucose 97 65 - 99 mg/dL   BUN 10 6 - 24 mg/dL   Creatinine, Ser 0.63 0.57 - 1.00 mg/dL   eGFR 102 >59 mL/min/1.73   BUN/Creatinine Ratio 16 9 - 23   Sodium 141 134 - 144 mmol/L   Potassium 4.3 3.5 - 5.2 mmol/L   Chloride 98 96 - 106 mmol/L   CO2 25 20 - 29 mmol/L   Calcium 10.4 (H) 8.7 - 10.2 mg/dL   Total Protein 7.8 6.0 - 8.5 g/dL   Albumin 5.1 (H) 3.8 - 4.9 g/dL   Globulin, Total 2.7 1.5 - 4.5 g/dL   Albumin/Globulin Ratio 1.9 1.2 - 2.2   Bilirubin Total 0.2 0.0 - 1.2 mg/dL   Alkaline Phosphatase 105 44 - 121 IU/L   AST 24 0 - 40 IU/L   ALT 28 0 - 32 IU/L  Lipid Panel w/o Chol/HDL Ratio  Result Value Ref Range   Cholesterol, Total 172 100 - 199 mg/dL   Triglycerides 161 (H) 0 - 149 mg/dL   HDL 49 >39 mg/dL   VLDL Cholesterol Cal 28 5 - 40 mg/dL   LDL Chol Calc (NIH) 95 0 - 99 mg/dL  TSH  Result Value Ref Range   TSH 1.140 0.450 - 4.500 uIU/mL  Hemoglobin A1c  Result Value Ref Range   Hgb A1c MFr Bld 5.9 (H) 4.8 - 5.6 %   Est. average glucose Bld gHb Est-mCnc 123 mg/dL  Microalbumin, Urine Waived (STAT)  Result Value Ref Range   Microalb, Ur Waived 10 0 - 19 mg/L   Creatinine, Urine Waived 50 10 - 300 mg/dL   Microalb/Creat Ratio <30 <30 mg/g      Assessment & Plan:   Problem List Items Addressed This Visit       Other   Cellulitis    From dog bite on 10/20/20, is overall healing, but wound remains open with pink wound bed.  Granulation tissue present.  Unable to obtain culture due to no drainage at site today.  At this time recommend continue to wash would every morning, then apply small layer of Mupirocin ointment and then over top at thin layer of Santyl -- scripts sent.  Will monitor closely.  Bring back in one week for wound check.  Follow up plan: Return for Wound check.

## 2020-11-20 NOTE — Assessment & Plan Note (Signed)
From dog bite on 10/20/20, is overall healing, but wound remains open with pink wound bed.  Granulation tissue present.  Unable to obtain culture due to no drainage at site today.  At this time recommend continue to wash would every morning, then apply small layer of Mupirocin ointment and then over top at thin layer of Santyl -- scripts sent.  Will monitor closely.  Bring back in one week for wound check.

## 2020-11-21 ENCOUNTER — Telehealth: Payer: Self-pay

## 2020-11-21 NOTE — Telephone Encounter (Signed)
Prior authorization was initiated for prescription Santyl Ointment via CoverMyMeds.   Key: [id number]

## 2020-11-23 DIAGNOSIS — Z1231 Encounter for screening mammogram for malignant neoplasm of breast: Secondary | ICD-10-CM | POA: Diagnosis not present

## 2020-11-23 LAB — HM MAMMOGRAPHY

## 2020-11-27 ENCOUNTER — Ambulatory Visit: Payer: BC Managed Care – PPO | Admitting: Nurse Practitioner

## 2020-11-27 ENCOUNTER — Encounter: Payer: Self-pay | Admitting: Nurse Practitioner

## 2020-11-27 ENCOUNTER — Other Ambulatory Visit: Payer: Self-pay

## 2020-11-27 DIAGNOSIS — L03113 Cellulitis of right upper limb: Secondary | ICD-10-CM | POA: Diagnosis not present

## 2020-11-27 NOTE — Progress Notes (Signed)
BP 130/83   Pulse 84   Temp (!) 84 F (28.9 C) (Oral)   Wt 188 lb (85.3 kg)   LMP  (LMP Unknown)   SpO2 99%   BMI 32.07 kg/m    Subjective:    Patient ID: Carrie Arroyo, female    DOB: 1960-08-31, 60 y.o.   MRN: [id number]  HPI: Carrie Arroyo is a 60 y.o. female  Chief Complaint  Patient presents with   Wound Check    Patient is here for follow up on her wound check. Patient states she was not approved for the [facility] prescription until yesterday and she states she was not able to use it until last night and this morning. Patient states she was frustrated because she states she has not been able to use it until then.    SKIN INFECTION Follow-up for dog bite seen on  11/20/20 -- initially seen in ER on 10/20/20.  Was treated with Doxycycline and Flagyl -- completed full course.  Had follow-up in office on 10/30/20.  At last visit started Mupirocin and Santyl to wound.  She reports noticing improvement with this, although was just able to get Santyl yesterday. Duration: months Location: right forearm History of trauma in area: yes Pain: yes if directly touch area or hit it Quality: yes Severity: 5/10 Redness: yes Swelling: no Oozing: none Pus: none Fevers: no Nausea/vomiting: no Status: fluctuating Treatments attempted:antibiotics and warm compresses  Tetanus: UTD   Relevant past medical, surgical, family and social history reviewed and updated as indicated. Interim medical history since our last visit reviewed. Allergies and medications reviewed and updated.  Review of Systems  Constitutional:  Negative for activity change, appetite change, diaphoresis, fatigue and fever.  Respiratory:  Negative for cough, chest tightness and shortness of breath.   Cardiovascular:  Negative for chest pain, palpitations and leg swelling.  Skin:  Positive for wound.  Psychiatric/Behavioral: Negative.     Per HPI unless specifically indicated above     Objective:    BP 130/83    Pulse 84   Temp (!) 84 F (28.9 C) (Oral)   Wt 188 lb (85.3 kg)   LMP  (LMP Unknown)   SpO2 99%   BMI 32.07 kg/m   Wt Readings from Last 3 Encounters:  11/27/20 188 lb (85.3 kg)  11/20/20 188 lb 3.2 oz (85.4 kg)  10/30/20 186 lb (84.4 kg)    Physical Exam Vitals and nursing note reviewed.  Constitutional:      General: She is awake. She is not in acute distress.    Appearance: She is well-developed and overweight. She is not ill-appearing.  HENT:     Head: Normocephalic.     Right Ear: Hearing normal.     Left Ear: Hearing normal.  Eyes:     General: Lids are normal.        Right eye: No discharge.        Left eye: No discharge.     Conjunctiva/sclera: Conjunctivae normal.     Pupils: Pupils are equal, round, and reactive to light.  Neck:     Thyroid: No thyromegaly.     Vascular: No carotid bruit.  Cardiovascular:     Rate and Rhythm: Normal rate and regular rhythm.     Heart sounds: Normal heart sounds. No murmur heard.   No gallop.  Pulmonary:     Effort: Pulmonary effort is normal.     Breath sounds: Normal breath sounds.  Abdominal:  General: Bowel sounds are normal.     Palpations: Abdomen is soft. There is no hepatomegaly or splenomegaly.  Musculoskeletal:     Cervical back: Normal range of motion and neck supple.     Right lower leg: No edema.     Left lower leg: No edema.  Skin:    General: Skin is warm and dry.     Findings: Wound present.       Neurological:     Mental Status: She is alert and oriented to person, place, and time.  Psychiatric:        Attention and Perception: Attention normal.        Mood and Affect: Mood normal.        Behavior: Behavior normal. Behavior is cooperative.        Thought Content: Thought content normal.        Judgment: Judgment normal.    Results for orders placed or performed in visit on 07/12/20  Comprehensive metabolic panel  Result Value Ref Range   Glucose 97 65 - 99 mg/dL   BUN 10 6 - 24 mg/dL    Creatinine, Ser 0.63 0.57 - 1.00 mg/dL   eGFR 102 >59 mL/min/1.73   BUN/Creatinine Ratio 16 9 - 23   Sodium 141 134 - 144 mmol/L   Potassium 4.3 3.5 - 5.2 mmol/L   Chloride 98 96 - 106 mmol/L   CO2 25 20 - 29 mmol/L   Calcium 10.4 (H) 8.7 - 10.2 mg/dL   Total Protein 7.8 6.0 - 8.5 g/dL   Albumin 5.1 (H) 3.8 - 4.9 g/dL   Globulin, Total 2.7 1.5 - 4.5 g/dL   Albumin/Globulin Ratio 1.9 1.2 - 2.2   Bilirubin Total 0.2 0.0 - 1.2 mg/dL   Alkaline Phosphatase 105 44 - 121 IU/L   AST 24 0 - 40 IU/L   ALT 28 0 - 32 IU/L  Lipid Panel w/o Chol/HDL Ratio  Result Value Ref Range   Cholesterol, Total 172 100 - 199 mg/dL   Triglycerides 161 (H) 0 - 149 mg/dL   HDL 49 >39 mg/dL   VLDL Cholesterol Cal 28 5 - 40 mg/dL   LDL Chol Calc (NIH) 95 0 - 99 mg/dL  TSH  Result Value Ref Range   TSH 1.140 0.450 - 4.500 uIU/mL  Hemoglobin A1c  Result Value Ref Range   Hgb A1c MFr Bld 5.9 (H) 4.8 - 5.6 %   Est. average glucose Bld gHb Est-mCnc 123 mg/dL  Microalbumin, Urine Waived (STAT)  Result Value Ref Range   Microalb, Ur Waived 10 0 - 19 mg/L   Creatinine, Urine Waived 50 10 - 300 mg/dL   Microalb/Creat Ratio <30 <30 mg/g      Assessment & Plan:   Problem List Items Addressed This Visit       Other   Cellulitis    From dog bite on 10/20/20, is overall healing, but wound remains slightly open.  Granulation tissue present.  Unable to obtain culture due to no drainage at site today.  At this time recommend continue to wash would every morning, then apply small layer of Mupirocin ointment and then over top at thin layer of Santyl as is offering benefit.  Will monitor closely.  Bring back in one week for wound check.          Follow up plan: Return in about 1 week (around 12/04/2020) for Wound check.

## 2020-11-27 NOTE — Patient Instructions (Signed)
Wound Care, Adult Taking care of your wound properly can help to prevent pain, infection, and scarring. It can also help your wound heal more quickly. Follow instructionsfrom your health care provider about how to care for your wound. Supplies needed: Soap and water. Wound cleanser. Gauze. If needed, a clean bandage (dressing) or other type of wound dressing material to cover or place in the wound. Follow your health care provider's instructions about what dressing supplies to use. Cream or ointment to apply to the wound, if told by your health care provider. How to care for your wound Cleaning the wound Ask your health care provider how to clean the wound. This may include: Using mild soap and water or a wound cleanser. Using a clean gauze to pat the wound dry after cleaning it. Do not rub or scrub the wound. Dressing care Wash your hands with soap and water for at least 20 seconds before and after you change the dressing. If soap and water are not available, use hand sanitizer. Change your dressing as told by your health care provider. This may include: Cleaning or rinsing out (irrigating) the wound. Placing a dressing over the wound or in the wound (packing). Covering the wound with an outer dressing. Leave any stitches (sutures), skin glue, or adhesive strips in place. These skin closures may need to stay in place for 2 weeks or longer. If adhesive strip edges start to loosen and curl up, you may trim the loose edges. Do not remove adhesive strips completely unless your health care provider tells you to do that. Ask your health care provider when you can leave the wound uncovered. Checking for infection Check your wound area every day for signs of infection. Check for: More redness, swelling, or pain. Fluid or blood. Warmth. Pus or a bad smell.  Follow these instructions at home Medicines If you were prescribed an antibiotic medicine, cream, or ointment, take or apply it as told by  your health care provider. Do not stop using the antibiotic even if your condition improves. If you were prescribed pain medicine, take it 30 minutes before you do any wound care or as told by your health care provider. Take over-the-counter and prescription medicines only as told by your health care provider. Eating and drinking Eat a diet that includes protein, vitamin A, vitamin C, and other nutrient-rich foods to help the wound heal. Foods rich in protein include meat, fish, eggs, dairy, beans, and nuts. Foods rich in vitamin A include carrots and dark green, leafy vegetables. Foods rich in vitamin C include citrus fruits, tomatoes, broccoli, and peppers. Drink enough fluid to keep your urine pale yellow. General instructions Do not take baths, swim, use a hot tub, or do anything that would put the wound underwater until your health care provider approves. Ask your health care provider if you may take showers. You may only be allowed to take sponge baths. Do not scratch or pick at the wound. Keep it covered as told by your health care provider. Return to your normal activities as told by your health care provider. Ask your health care provider what activities are safe for you. Protect your wound from the sun when you are outside for the first 6 months, or for as long as told by your health care provider. Cover up the scar area or apply sunscreen that has an SPF of at least 30. Do not use any products that contain nicotine or tobacco, such as cigarettes, e-cigarettes, and chewing tobacco.   These may delay wound healing. If you need help quitting, ask your health care provider. Keep all follow-up visits as told by your health care provider. This is important. Contact a health care provider if: You received a tetanus shot and you have swelling, severe pain, redness, or bleeding at the injection site. Your pain is not controlled with medicine. You have any of these signs of infection: More  redness, swelling, or pain around the wound. Fluid or blood coming from the wound. Warmth coming from the wound. Pus or a bad smell coming from the wound. A fever or chills. You are nauseous or you vomit. You are dizzy. Get help right away if: You have a red streak of skin near the area around your wound. Your wound has been closed with staples, sutures, skin glue, or adhesive strips and it begins to open up and separate. Your wound is bleeding, and the bleeding does not stop with gentle pressure. You have a rash. You faint. You have trouble breathing. These symptoms may represent a serious problem that is an emergency. Do not wait to see if the symptoms will go away. Get medical help right away. Call your local emergency services (911 in the [place] Do not drive yourself to the hospital. Summary Always wash your hands with soap and water for at least 20 seconds before and after changing your dressing. Change your dressing as told by your health care provider. To help with healing, eat foods that are rich in protein, vitamin A, vitamin C, and other nutrients. Check your wound every day for signs of infection. Contact your health care provider if you suspect that your wound is infected. This information is not intended to replace advice given to you by your health care provider. Make sure you discuss any questions you have with your healthcare provider. Document Revised: 12/03/2018 Document Reviewed: 12/03/2018 [employer]vier Patient Education  2022 [employer]  

## 2020-11-27 NOTE — Assessment & Plan Note (Signed)
From dog bite on 10/20/20, is overall healing, but wound remains slightly open.  Granulation tissue present.  Unable to obtain culture due to no drainage at site today.  At this time recommend continue to wash would every morning, then apply small layer of Mupirocin ointment and then over top at thin layer of Santyl as is offering benefit.  Will monitor closely.  Bring back in one week for wound check.

## 2020-12-04 ENCOUNTER — Encounter: Payer: Self-pay | Admitting: Nurse Practitioner

## 2020-12-04 ENCOUNTER — Other Ambulatory Visit: Payer: Self-pay

## 2020-12-04 ENCOUNTER — Encounter: Payer: BC Managed Care – PPO | Admitting: Nurse Practitioner

## 2020-12-04 VITALS — BP 113/76 | HR 91 | Temp 98.5°F | Wt 188.0 lb

## 2020-12-04 NOTE — Progress Notes (Signed)
Error, patient not seen, left before visit.

## 2020-12-12 DIAGNOSIS — G4733 Obstructive sleep apnea (adult) (pediatric): Secondary | ICD-10-CM | POA: Diagnosis not present

## 2020-12-13 DIAGNOSIS — G4733 Obstructive sleep apnea (adult) (pediatric): Secondary | ICD-10-CM | POA: Diagnosis not present

## 2021-01-04 DIAGNOSIS — D110 Benign neoplasm of parotid gland: Secondary | ICD-10-CM | POA: Diagnosis not present

## 2021-01-08 ENCOUNTER — Ambulatory Visit: Payer: Self-pay | Admitting: *Deleted

## 2021-01-08 ENCOUNTER — Encounter: Payer: Self-pay | Admitting: Nurse Practitioner

## 2021-01-08 ENCOUNTER — Ambulatory Visit: Payer: BC Managed Care – PPO | Admitting: Nurse Practitioner

## 2021-01-08 ENCOUNTER — Other Ambulatory Visit: Payer: Self-pay

## 2021-01-08 VITALS — BP 116/73 | HR 85 | Temp 98.5°F | Wt 186.2 lb

## 2021-01-08 DIAGNOSIS — L259 Unspecified contact dermatitis, unspecified cause: Secondary | ICD-10-CM | POA: Diagnosis not present

## 2021-01-08 MED ORDER — TRIAMCINOLONE ACETONIDE 0.1 % EX CREA: 1.0000 "application " | TOPICAL_CREAM | Freq: Two times a day (BID) | CUTANEOUS | 0 refills | Status: DC

## 2021-01-08 MED ORDER — PREDNISONE 10 MG PO TABS: ORAL_TABLET | ORAL | 0 refills | Status: DC

## 2021-01-08 NOTE — Progress Notes (Signed)
Acute Office Visit  Subjective:    Patient ID: Carrie Arroyo, female    DOB: 08/01/1960, 60 y.o.   MRN: [id number]  Chief Complaint  Patient presents with   Urticaria    Pt states she started breaking out in hives about 36 hours ago, states she thinks she was bit by something in a field. States she has severe itching on her upper arms and legs    HPI Patient is in today for hives that developed Sunday night after being outside all weekend at a dog competition. She was wearing shorts and a short sleeve shirt.   RASH  Duration:  days  Location: arms and legs  Itching: yes Burning: no Redness: no Oozing: no Scaling: no Blisters: no Painful: no Fevers: no Change in detergents/soaps/personal care products: no Recent illness: no Recent travel:no History of same: no Context: worse Alleviating factors: nothing Treatments attempted:benadryl Shortness of breath: no  Throat/tongue swelling: no Myalgias/arthralgias: no   Past Medical History:  Diagnosis Date   Allergy    Boils    Elevated liver enzymes    Humerus fracture    right   Hypertension    Sleep apnea     Past Surgical History:  Procedure Laterality Date   APPENDECTOMY     DENTAL SURGERY  01/12/2020   OVARIAN CYST REMOVAL     PAROTIDECTOMY     TOTAL ABDOMINAL HYSTERECTOMY      Family History  Adopted: Yes  Family history unknown: Yes    Social History   Socioeconomic History   Marital status: Married    Spouse name: Not on file   Number of children: Not on file   Years of education: Not on file   Highest education level: Not on file  Occupational History   Not on file  Tobacco Use   Smoking status: Never   Smokeless tobacco: Never  Vaping Use   Vaping Use: Never used  Substance and Sexual Activity   Alcohol use: Yes    Alcohol/week: 2.0 standard drinks    Types: 2 Glasses of wine per week   Drug use: No   Sexual activity: Never  Other Topics Concern   Not on file  Social History  Narrative   Not on file   Social Determinants of Health   Financial Resource Strain: Not on file  Food Insecurity: Not on file  Transportation Needs: Not on file  Physical Activity: Not on file  Stress: Not on file  Social Connections: Not on file  Intimate Partner Violence: Not on file    Outpatient Medications Prior to Visit  Medication Sig Dispense Refill   aspirin 81 MG tablet Take 81 mg by mouth daily.     benazepril (LOTENSIN) 20 MG tablet Take 1 tablet (20 mg total) by mouth daily. 90 tablet 4   Cholecalciferol (VITAMIN D3) 50 MCG (2000 UT) CAPS Take 1 capsule by mouth daily.     clindamycin (CLEOCIN T) 1 % lotion Apply topically.     clobetasol cream (TEMOVATE) 0.05 % as needed.  0   collagenase (SANTYL) ointment Apply 1 application topically daily. Place antibiotic ointment to wound based first and then apply this ointment to wound, then cover with non adherent dressing. 30 g 0   fluticasone (FLONASE) 50 MCG/ACT nasal spray Place 2 sprays into both nostrils daily.     loratadine (CLARITIN) 10 MG tablet Take 10 mg by mouth daily.     minocycline (MINOCIN,DYNACIN) 100 MG capsule TAKE  ONE CAPSULE BY MOUTH TWICE A DAY FOR FLARES  4   Multiple Vitamin (MULTIVITAMIN) tablet Take 1 tablet by mouth daily.     mupirocin ointment (BACTROBAN) 2 % Apply 1 application topically daily. 22 g 0   omeprazole (PRILOSEC) 20 MG capsule Take 20 mg by mouth daily.     simvastatin (ZOCOR) 40 MG tablet Take 1 tablet (40 mg total) by mouth at bedtime. 90 tablet 3   SODIUM FLUORIDE 5000 PPM 1.1 % PSTE Take by mouth at bedtime.     No facility-administered medications prior to visit.    Allergies  Allergen Reactions   Penicillin G Benzathine    Sulfa Antibiotics     Review of Systems  Constitutional: Negative.   Respiratory: Negative.    Cardiovascular: Negative.   Gastrointestinal: Negative.   Genitourinary: Negative.   Musculoskeletal: Negative.   Skin:  Positive for rash.       Objective:    Physical Exam Vitals and nursing note reviewed.  Constitutional:      General: She is not in acute distress.    Appearance: Normal appearance.  HENT:     Head: Normocephalic.  Eyes:     Conjunctiva/sclera: Conjunctivae normal.  Cardiovascular:     Rate and Rhythm: Normal rate.     Pulses: Normal pulses.  Pulmonary:     Effort: Pulmonary effort is normal.  Musculoskeletal:     Cervical back: Normal range of motion.  Skin:    General: Skin is warm.     Findings: Rash present.     Comments: Urticarial rash to bilateral arms and legs. No drainage or signs of infection  Neurological:     General: No focal deficit present.     Mental Status: She is alert and oriented to person, place, and time.  Psychiatric:        Mood and Affect: Mood normal.        Behavior: Behavior normal.        Thought Content: Thought content normal.        Judgment: Judgment normal.    BP 116/73   Pulse 85   Temp 98.5 F (36.9 C) (Oral)   Wt 186 lb 3.2 oz (84.5 kg)   LMP  (LMP Unknown)   SpO2 99%   BMI 31.76 kg/m  Wt Readings from Last 3 Encounters:  01/08/21 186 lb 3.2 oz (84.5 kg)  12/04/20 188 lb (85.3 kg)  11/27/20 188 lb (85.3 kg)    Health Maintenance Due  Topic Date Due   Zoster Vaccines- Shingrix (1 of 2) Never done   COVID-19 Vaccine (3 - Booster) 12/08/2019   MAMMOGRAM  11/21/2020    There are no preventive care reminders to display for this patient.   Lab Results  Component Value Date   TSH 1.140 07/12/2020   Lab Results  Component Value Date   WBC 12.8 (H) 01/13/2020   HGB 15.0 01/13/2020   HCT 45.0 01/13/2020   MCV 95 01/13/2020   PLT 354 01/13/2020   Lab Results  Component Value Date   NA 141 07/12/2020   K 4.3 07/12/2020   CO2 25 07/12/2020   GLUCOSE 97 07/12/2020   BUN 10 07/12/2020   CREATININE 0.63 07/12/2020   BILITOT 0.2 07/12/2020   ALKPHOS 105 07/12/2020   AST 24 07/12/2020   ALT 28 07/12/2020   PROT 7.8 07/12/2020   ALBUMIN 5.1  (H) 07/12/2020   CALCIUM 10.4 (H) 07/12/2020   ANIONGAP 10 04/28/2015  EGFR 102 07/12/2020   Lab Results  Component Value Date   CHOL 172 07/12/2020   Lab Results  Component Value Date   HDL 49 07/12/2020   Lab Results  Component Value Date   LDLCALC 95 07/12/2020   Lab Results  Component Value Date   TRIG 161 (H) 07/12/2020   No results found for: CHOLHDL Lab Results  Component Value Date   HGBA1C 5.9 (H) 07/12/2020       Assessment & Plan:   Problem List Items Addressed This Visit   None Visit Diagnoses     Contact dermatitis, unspecified contact dermatitis type, unspecified trigger    -  Primary   Will treat with prednisone taper and triamcinolone cream. Unknown trigger at this time. Can take benadryl prn. F/U if not improving        Meds ordered this encounter  Medications   predniSONE (DELTASONE) 10 MG tablet    Sig: Take 6 tablets today and tomorrow, then 5 tablets the next 2 days, then decrease by 1 tablet every other day until gone    Dispense:  42 tablet    Refill:  0   triamcinolone cream (KENALOG) 0.1 %    Sig: Apply 1 application topically 2 (two) times daily.    Dispense:  30 g    Refill:  0     Charyl Dancer, NP

## 2021-01-08 NOTE — Patient Instructions (Addendum)
You can take 50mg  of benadryl every 6 hours as needed for itching, may make you sleepy You can use triamcinolone cream twice a day as needed Start the prednisone as soon as you get it from the pharmacy  Oatmeal bath and tea tree oil over the counter for itching

## 2021-01-08 NOTE — Telephone Encounter (Signed)
Reason for Disposition  [1] MODERATE-SEVERE hives persist (i.e., hives interfere with normal activities or work) AND [2] taking antihistamine (e.g., Benadryl, Claritin) > 24 hours  Answer Assessment - Initial Assessment Questions 1. APPEARANCE: "What does the rash look like?"      Large red hives on arms and R leg 2. LOCATION: "Where is the rash located?"      Arms, R leg 3. NUMBER: "How many hives are there?"      Large red areas 4. SIZE: "How big are the hives?" (inches, cm, compare to coins) "Do they all look the same or is there lots of variation in shape and size?"      Nickels and quarters 5. ONSET: "When did the hives begin?" (Hours or days ago)      Yesterday arms 6. ITCHING: "Does it itch?" If Yes, ask: "How bad is the itch?"    - MILD: doesn't interfere with normal activities   - MODERATE-SEVERE: interferes with work, school, sleep, or other activities      severe 7. RECURRENT PROBLEM: "Have you had hives before?" If Yes, ask: "When was the last time?" and "What happened that time?"      Yes- but that was a medication reaction 8. TRIGGERS: "Were you exposed to any new food, plant, cosmetic product or animal just before the hives began?"     Outside at field trail- this weekend 9. OTHER SYMPTOMS: "Do you have any other symptoms?" (e.g., fever, tongue swelling, difficulty breathing, abdominal pain)     no 10. PREGNANCY: "Is there any chance you are pregnant?" "When was your last menstrual period?"       na  Protocols used: Hives-A-AH

## 2021-01-12 DIAGNOSIS — G4733 Obstructive sleep apnea (adult) (pediatric): Secondary | ICD-10-CM | POA: Diagnosis not present

## 2021-01-13 DIAGNOSIS — G4733 Obstructive sleep apnea (adult) (pediatric): Secondary | ICD-10-CM | POA: Diagnosis not present

## 2021-01-14 ENCOUNTER — Other Ambulatory Visit: Payer: Self-pay | Admitting: Nurse Practitioner

## 2021-01-14 NOTE — Telephone Encounter (Signed)
Requested Prescriptions  Pending Prescriptions Disp Refills  . simvastatin (ZOCOR) 40 MG tablet [Pharmacy Med Name: SIMVASTATIN 40 MG TABLET] 90 tablet 3    Sig: TAKE 1 TABLET BY MOUTH EVERYDAY AT BEDTIME     Cardiovascular:  Antilipid - Statins Failed - 01/14/2021  1:25 AM      Failed - Triglycerides in normal range and within 360 days    Triglycerides  Date Value Ref Range Status  07/12/2020 161 (H) 0 - 149 mg/dL Final   Triglycerides Piccolo,Waived  Date Value Ref Range Status  01/15/2015 147 <150 mg/dL Final    Comment:                            Normal                   <150                         Borderline High     150 - 199                         High                200 - 499                         Very High                >499          Passed - Total Cholesterol in normal range and within 360 days    Cholesterol, Total  Date Value Ref Range Status  07/12/2020 172 100 - 199 mg/dL Final   Cholesterol Piccolo, Waived  Date Value Ref Range Status  01/15/2015 179 <200 mg/dL Final    Comment:                            Desirable                <200                         Borderline High      200- 239                         High                     >239          Passed - LDL in normal range and within 360 days    LDL Chol Calc (NIH)  Date Value Ref Range Status  07/12/2020 95 0 - 99 mg/dL Final         Passed - HDL in normal range and within 360 days    HDL  Date Value Ref Range Status  07/12/2020 49 >39 mg/dL Final         Passed - Patient is not pregnant      Passed - Valid encounter within last 12 months    Recent Outpatient Visits          6 days ago Contact dermatitis, unspecified contact dermatitis type, unspecified trigger   [facility] [place], Lauren A, NP   1 month ago Cellulitis of right upper extremity  [facility] [place], [place] T, NP   1 month ago Cellulitis of right upper extremity   [facility]  [place], Dorie Rank, NP   2 months ago Open wound of right forearm, subsequent encounter   [facility] McEl[place]e, Lauren A, NP   5 months ago Rib pain on left side   [facility] [place], Dorie Rank, NP      Future Appointments            In 2 days [place], Dorie Rank, NP [facility]

## 2021-01-16 ENCOUNTER — Ambulatory Visit (INDEPENDENT_AMBULATORY_CARE_PROVIDER_SITE_OTHER): Payer: BC Managed Care – PPO | Admitting: Nurse Practitioner

## 2021-01-16 ENCOUNTER — Encounter: Payer: Self-pay | Admitting: Nurse Practitioner

## 2021-01-16 ENCOUNTER — Other Ambulatory Visit: Payer: Self-pay

## 2021-01-16 VITALS — BP 134/84 | HR 81 | Temp 98.2°F | Ht 65.0 in | Wt 189.8 lb

## 2021-01-16 DIAGNOSIS — Z0000 Encounter for general adult medical examination without abnormal findings: Secondary | ICD-10-CM | POA: Diagnosis not present

## 2021-01-16 DIAGNOSIS — E348 Other specified endocrine disorders: Secondary | ICD-10-CM | POA: Diagnosis not present

## 2021-01-16 DIAGNOSIS — R7989 Other specified abnormal findings of blood chemistry: Secondary | ICD-10-CM

## 2021-01-16 DIAGNOSIS — I10 Essential (primary) hypertension: Secondary | ICD-10-CM | POA: Diagnosis not present

## 2021-01-16 DIAGNOSIS — E7800 Pure hypercholesterolemia, unspecified: Secondary | ICD-10-CM | POA: Diagnosis not present

## 2021-01-16 DIAGNOSIS — R7303 Prediabetes: Secondary | ICD-10-CM | POA: Diagnosis not present

## 2021-01-16 DIAGNOSIS — K219 Gastro-esophageal reflux disease without esophagitis: Secondary | ICD-10-CM

## 2021-01-16 DIAGNOSIS — Z6831 Body mass index (BMI) 31.0-31.9, adult: Secondary | ICD-10-CM

## 2021-01-16 DIAGNOSIS — E559 Vitamin D deficiency, unspecified: Secondary | ICD-10-CM

## 2021-01-16 DIAGNOSIS — E6609 Other obesity due to excess calories: Secondary | ICD-10-CM

## 2021-01-16 DIAGNOSIS — G4733 Obstructive sleep apnea (adult) (pediatric): Secondary | ICD-10-CM

## 2021-01-16 DIAGNOSIS — Z9989 Dependence on other enabling machines and devices: Secondary | ICD-10-CM

## 2021-01-16 LAB — URINALYSIS, ROUTINE W REFLEX MICROSCOPIC
Bilirubin, UA: NEGATIVE
Glucose, UA: NEGATIVE
Ketones, UA: NEGATIVE
Leukocytes,UA: NEGATIVE
Nitrite, UA: NEGATIVE
Protein,UA: NEGATIVE
Specific Gravity, UA: 1.01 (ref 1.005–1.030)
Urobilinogen, Ur: 0.2 mg/dL (ref 0.2–1.0)
pH, UA: 6 (ref 5.0–7.5)

## 2021-01-16 LAB — MICROSCOPIC EXAMINATION
Bacteria, UA: NONE SEEN
WBC, UA: NONE SEEN /hpf (ref 0–5)

## 2021-01-16 LAB — BAYER DCA HB A1C WAIVED: HB A1C (BAYER DCA - WAIVED): 5.6 % (ref 4.8–5.6)

## 2021-01-16 MED ORDER — BENAZEPRIL HCL 20 MG PO TABS: 20.0000 mg | ORAL_TABLET | Freq: Every day | ORAL | 4 refills | Status: DC

## 2021-01-16 MED ORDER — SIMVASTATIN 40 MG PO TABS: ORAL_TABLET | ORAL | 4 refills | Status: DC

## 2021-01-16 NOTE — Progress Notes (Signed)
BP 134/84   Pulse 81   Temp 98.2 F (36.8 C) (Oral)   Ht 5' 5"  (1.651 m)   Wt 189 lb 12.8 oz (86.1 kg)   LMP  (LMP Unknown)   SpO2 100%   BMI 31.58 kg/m    Subjective:    Patient ID: Carrie Arroyo, female    DOB: 1960-09-25, 60 y.o.   MRN: [id number]  HPI: Carrie Arroyo is a 60 y.o. female presenting on 01/16/2021 for comprehensive medical examination. Current medical complaints include:none  She currently lives with: husband Menopausal Symptoms: no  HYPERTENSION / HYPERLIPIDEMIA Continues on Benazepril, Simvastatin, ASA.  No smoking (former smoker -- in college years), just occasional alcohol use.  She saw neurology on 04/11/20 for OSA and reports using CPAP nightly.  History of Werner's Syndrome. Satisfied with current treatment? yes Duration of hypertension: chronic BP monitoring frequency:  BP range:  BP medication side effects: no Duration of hyperlipidemia: chronic Cholesterol medication side effects: no Cholesterol supplements: none Medication compliance: good compliance Aspirin: yes Recent stressors: no Recurrent headaches: no Visual changes: no Palpitations: no Dyspnea: no Chest pain: no Lower extremity edema: no Dizzy/lightheaded: no  The 10-year ASCVD risk score (Arnett DK, et al., 2019) is: 4.2%   Values used to calculate the score:     Age: 31 years     Sex: Female     Is Non-Hispanic African American: No     Diabetic: No     Tobacco smoker: No     Systolic Blood Pressure: 536 mmHg     Is BP treated: Yes     HDL Cholesterol: 49 mg/dL     Total Cholesterol: 172 mg/dL    PREDIABETES: Last A1c 5.9%.  Has history of abnormal thyroid levels, recent were stable. Polydipsia/polyuria: no Visual disturbance: no Chest pain: no Paresthesias: no    GERD Continues on Prilosec, generic.  She has tried cutting back slowly, but heart burn returns each time she tries this. GERD control status: stable  Satisfied with current treatment? yes Heartburn  frequency: none Medication side effects: no  Medication compliance: stable Previous GERD medications: TUMS Antacid use frequency:  none  Dysphagia: no Odynophagia:  no Hematemesis: no Blood in stool: no EGD: yes   VITAMIN D DEFICIENCY: No current supplement, last level 31.3 in March 2021  Denies any recent falls or fractures.  No increased fatigue or muscle pain.   Depression Screen done today and results listed below:  Depression screen [facility] 2/9 01/16/2021 01/13/2020 11/16/2018 09/15/2017 08/04/2016  Decreased Interest 0 0 0 0 0  Down, Depressed, Hopeless 0 0 0 0 0  PHQ - 2 Score 0 0 0 0 0  Altered sleeping 0 - 0 0 -  Tired, decreased energy 0 - 0 0 -  Change in appetite 0 - 0 0 -  Feeling bad or failure about yourself  0 - 0 0 -  Trouble concentrating 0 - 0 0 -  Moving slowly or fidgety/restless 0 - 0 0 -  Suicidal thoughts 0 - 0 0 -  PHQ-9 Score 0 - 0 0 -  Difficult doing work/chores Not difficult at all - Not difficult at all - -   Fall Risk 08/04/2016 07/12/2019 01/13/2020 10/20/2020 01/16/2021  Falls in the past year? Yes 0 0 - 0  Was there an injury with Fall? Yes 0 0 - 0  Was there an injury with Fall? broke right humerus - - - -  Fall Risk Category Calculator -  0 0 - 0  Fall Risk Category - Low Low - Low  Patient Fall Risk Level - Low fall risk Low fall risk Low fall risk Low fall risk  Patient at Risk for Falls Due to - - No Fall Risks - No Fall Risks  Fall risk Follow up - Falls evaluation completed Falls evaluation completed - Education provided    Functional Status Survey: Is the patient deaf or have difficulty hearing?: No Does the patient have difficulty seeing, even when wearing glasses/contacts?: No Does the patient have difficulty concentrating, remembering, or making decisions?: No Does the patient have difficulty walking or climbing stairs?: No Does the patient have difficulty dressing or bathing?: No Does the patient have difficulty doing errands alone such as  visiting a doctor's office or shopping?: No   Past Medical History:  Past Medical History:  Diagnosis Date   Allergy    Boils    Elevated liver enzymes    Humerus fracture    right   Hypertension    Sleep apnea     Surgical History:  Past Surgical History:  Procedure Laterality Date   APPENDECTOMY     DENTAL SURGERY  01/12/2020   OVARIAN CYST REMOVAL     PAROTIDECTOMY     TOTAL ABDOMINAL HYSTERECTOMY      Medications:  Current Outpatient Medications on File Prior to Visit  Medication Sig   aspirin 81 MG tablet Take 81 mg by mouth daily.   Cholecalciferol (VITAMIN D3) 50 MCG (2000 UT) CAPS Take 1 capsule by mouth daily.   clindamycin (CLEOCIN T) 1 % lotion Apply topically.   clobetasol cream (TEMOVATE) 0.05 % as needed.   fluticasone (FLONASE) 50 MCG/ACT nasal spray Place 2 sprays into both nostrils daily.   loratadine (CLARITIN) 10 MG tablet Take 10 mg by mouth daily.   minocycline (MINOCIN,DYNACIN) 100 MG capsule TAKE ONE CAPSULE BY MOUTH TWICE A DAY FOR FLARES   Multiple Vitamin (MULTIVITAMIN) tablet Take 1 tablet by mouth daily.   mupirocin ointment (BACTROBAN) 2 % Apply 1 application topically daily.   omeprazole (PRILOSEC) 20 MG capsule Take 20 mg by mouth daily.   predniSONE (DELTASONE) 10 MG tablet Take 6 tablets today and tomorrow, then 5 tablets the next 2 days, then decrease by 1 tablet every other day until gone   SODIUM FLUORIDE 5000 PPM 1.1 % PSTE Take by mouth at bedtime.   triamcinolone cream (KENALOG) 0.1 % Apply 1 application topically 2 (two) times daily.   No current facility-administered medications on file prior to visit.    Allergies:  Allergies  Allergen Reactions   Penicillin G Benzathine    Sulfa Antibiotics     Social History:  Social History   Socioeconomic History   Marital status: Married    Spouse name: Not on file   Number of children: Not on file   Years of education: Not on file   Highest education level: Not on file   Occupational History   Not on file  Tobacco Use   Smoking status: Never   Smokeless tobacco: Never  Vaping Use   Vaping Use: Never used  Substance and Sexual Activity   Alcohol use: Yes    Alcohol/week: 2.0 standard drinks    Types: 2 Glasses of wine per week   Drug use: No   Sexual activity: Never  Other Topics Concern   Not on file  Social History Narrative   Not on file   Social Determinants of Health  Financial Resource Strain: Low Risk    Difficulty of Paying Living Expenses: Not hard at all  Food Insecurity: No Food Insecurity   Worried About Charity fundraiser in the Last Year: Never true   Ran Out of Food in the Last Year: Never true  Transportation Needs: No Transportation Needs   Lack of Transportation (Medical): No   Lack of Transportation (Non-Medical): No  Physical Activity: Insufficiently Active   Days of Exercise per Week: 3 days   Minutes of Exercise per Session: 40 min  Stress: No Stress Concern Present   Feeling of Stress : Only a little  Social Connections: Moderately Isolated   Frequency of Communication with Friends and Family: More than three times a week   Frequency of Social Gatherings with Friends and Family: More than three times a week   Attends Religious Services: Never   Marine scientist or Organizations: No   Attends Music therapist: Never   Marital Status: Married  Human resources officer Violence: Not At Risk   Fear of Current or Ex-Partner: No   Emotionally Abused: No   Physically Abused: No   Sexually Abused: No   Social History   Tobacco Use  Smoking Status Never  Smokeless Tobacco Never   Social History   Substance and Sexual Activity  Alcohol Use Yes   Alcohol/week: 2.0 standard drinks   Types: 2 Glasses of wine per week    Family History:  Family History  Adopted: Yes  Family history unknown: Yes    Past medical history, surgical history, medications, allergies, family history and social history  reviewed with patient today and changes made to appropriate areas of the chart.   Review of Systems - negative All other ROS negative except what is listed above and in the HPI.      Objective:    BP 134/84   Pulse 81   Temp 98.2 F (36.8 C) (Oral)   Ht 5' 5"  (1.651 m)   Wt 189 lb 12.8 oz (86.1 kg)   LMP  (LMP Unknown)   SpO2 100%   BMI 31.58 kg/m   Wt Readings from Last 3 Encounters:  01/16/21 189 lb 12.8 oz (86.1 kg)  01/08/21 186 lb 3.2 oz (84.5 kg)  12/04/20 188 lb (85.3 kg)    Physical Exam Vitals and nursing note reviewed.  Constitutional:      General: She is awake. She is not in acute distress.    Appearance: She is well-developed and well-groomed. She is obese. She is not ill-appearing or toxic-appearing.    HENT:     Head: Normocephalic and atraumatic.     Right Ear: Hearing, tympanic membrane, ear canal and external ear normal. No drainage.     Left Ear: Hearing, tympanic membrane, ear canal and external ear normal. No drainage.     Nose: Nose normal.     Right Sinus: No maxillary sinus tenderness or frontal sinus tenderness.     Left Sinus: No maxillary sinus tenderness or frontal sinus tenderness.     Mouth/Throat:     Mouth: Mucous membranes are moist.     Pharynx: Oropharynx is clear. Uvula midline. No pharyngeal swelling, oropharyngeal exudate or posterior oropharyngeal erythema.  Eyes:     General: Lids are normal.        Right eye: No discharge.        Left eye: No discharge.     Extraocular Movements: Extraocular movements intact.     Conjunctiva/sclera:  Conjunctivae normal.     Pupils: Pupils are equal, round, and reactive to light.     Visual Fields: Right eye visual fields normal and left eye visual fields normal.  Neck:     Thyroid: No thyromegaly.     Vascular: No carotid bruit.     Trachea: Trachea normal.  Cardiovascular:     Rate and Rhythm: Normal rate and regular rhythm.     Heart sounds: Normal heart sounds. No murmur heard.   No  gallop.  Pulmonary:     Effort: Pulmonary effort is normal. No accessory muscle usage or respiratory distress.     Breath sounds: Normal breath sounds.  Chest:  Breasts:    Right: Normal.     Left: Normal.  Abdominal:     General: Bowel sounds are normal.     Palpations: Abdomen is soft. There is no hepatomegaly or splenomegaly.     Tenderness: There is no abdominal tenderness.  Musculoskeletal:        General: Normal range of motion.     Cervical back: Normal range of motion and neck supple.     Right lower leg: No edema.     Left lower leg: No edema.  Lymphadenopathy:     Head:     Right side of head: No submental, submandibular, tonsillar, preauricular or posterior auricular adenopathy.     Left side of head: No submental, submandibular, tonsillar, preauricular or posterior auricular adenopathy.     Cervical: No cervical adenopathy.     Upper Body:     Right upper body: No supraclavicular, axillary or pectoral adenopathy.     Left upper body: No supraclavicular, axillary or pectoral adenopathy.  Skin:    General: Skin is warm and dry.     Capillary Refill: Capillary refill takes less than 2 seconds.     Findings: No rash.  Neurological:     Mental Status: She is alert and oriented to person, place, and time.     Gait: Gait is intact.     Deep Tendon Reflexes: Reflexes are normal and symmetric.     Reflex Scores:      Brachioradialis reflexes are 2+ on the right side and 2+ on the left side.      Patellar reflexes are 2+ on the right side and 2+ on the left side. Psychiatric:        Attention and Perception: Attention normal.        Mood and Affect: Mood normal.        Speech: Speech normal.        Behavior: Behavior normal. Behavior is cooperative.        Thought Content: Thought content normal.        Judgment: Judgment normal.   Results for orders placed or performed in visit on 07/12/20  Comprehensive metabolic panel  Result Value Ref Range   Glucose 97 65 - 99  mg/dL   BUN 10 6 - 24 mg/dL   Creatinine, Ser 0.63 0.57 - 1.00 mg/dL   eGFR 102 >59 mL/min/1.73   BUN/Creatinine Ratio 16 9 - 23   Sodium 141 134 - 144 mmol/L   Potassium 4.3 3.5 - 5.2 mmol/L   Chloride 98 96 - 106 mmol/L   CO2 25 20 - 29 mmol/L   Calcium 10.4 (H) 8.7 - 10.2 mg/dL   Total Protein 7.8 6.0 - 8.5 g/dL   Albumin 5.1 (H) 3.8 - 4.9 g/dL   Globulin, Total 2.7 1.5 -  4.5 g/dL   Albumin/Globulin Ratio 1.9 1.2 - 2.2   Bilirubin Total 0.2 0.0 - 1.2 mg/dL   Alkaline Phosphatase 105 44 - 121 IU/L   AST 24 0 - 40 IU/L   ALT 28 0 - 32 IU/L  Lipid Panel w/o Chol/HDL Ratio  Result Value Ref Range   Cholesterol, Total 172 100 - 199 mg/dL   Triglycerides 161 (H) 0 - 149 mg/dL   HDL 49 >39 mg/dL   VLDL Cholesterol Cal 28 5 - 40 mg/dL   LDL Chol Calc (NIH) 95 0 - 99 mg/dL  TSH  Result Value Ref Range   TSH 1.140 0.450 - 4.500 uIU/mL  Hemoglobin A1c  Result Value Ref Range   Hgb A1c MFr Bld 5.9 (H) 4.8 - 5.6 %   Est. average glucose Bld gHb Est-mCnc 123 mg/dL  Microalbumin, Urine Waived (STAT)  Result Value Ref Range   Microalb, Ur Waived 10 0 - 19 mg/L   Creatinine, Urine Waived 50 10 - 300 mg/dL   Microalb/Creat Ratio <30 <30 mg/g      Assessment & Plan:   Problem List Items Addressed This Visit       Cardiovascular and Mediastinum   Hypertension - Primary    Chronic, stable with BP at goal today.  Recommend she monitor BP at least a few mornings a week at home and document.  DASH diet at home.  Continue current medication regimen and adjust as needed.  Labs today: CMP, urine ALB, TSH, CBC.  Return in 6 months for follow-up.       Relevant Medications   benazepril (LOTENSIN) 20 MG tablet   simvastatin (ZOCOR) 40 MG tablet   Other Relevant Orders   CBC with Differential/Platelet   Comprehensive metabolic panel   Urinalysis, Routine w reflex microscopic     Respiratory   OSA on CPAP    Followed by neurology, continue 100% use of CPAP.        Digestive   GERD  (gastroesophageal reflux disease)    Chronic, stable.  Continue current medication regimen and adjust as needed.  Mag level today.      Relevant Orders   Magnesium     Musculoskeletal and Integument   Werner's syndrome    History of surgery -- parotidectomy right side.  Follows yearly with dermatology and they monitor.  Has some scar tissue remaining to area.        Other   Hyperlipidemia    Chronic, ongoing.  Continue current medication regimen.  Lipid panel today and adjust statin as needed.      Relevant Medications   benazepril (LOTENSIN) 20 MG tablet   simvastatin (ZOCOR) 40 MG tablet   Other Relevant Orders   Lipid Panel w/o Chol/HDL Ratio   Low TSH level    Recheck levels today.  Recent have been stable.      Relevant Orders   TSH   T4, free   Obesity    BMI 31.58.  Recommended eating smaller high protein, low fat meals more frequently and exercising 30 mins a day 5 times a week with a goal of 10-15lb weight loss in the next 3 months. Patient voiced their understanding and motivation to adhere to these recommendations.       Prediabetes    Ongoing and stable.  Praised for success with diet and exercise.  Continue this focus and recheck A1C today, if stable check Q6MOS to annually.      Relevant Orders  Bayer DCA Hb A1c Waived   Vitamin D deficiency    Ongoing, continue supplement and adjust dosing as needed.  Vit D level today.      Relevant Orders   VITAMIN D 25 Hydroxy (Vit-D Deficiency, Fractures)   Other Visit Diagnoses     Encounter for annual physical exam       Annual physical with labs today.  Health maintenance reviewed -- refuses flu and shingrix.        Follow up plan: Return in about 6 months (around 07/16/2021) for HTN/HLD, GERD, PREDIABETES.   LABORATORY TESTING:  - Pap smear: not applicable -- hysterectomy in 2000  IMMUNIZATIONS:   - Tdap: Tetanus vaccination status reviewed: last tetanus booster within 10 years. - Influenza:  Refused - Pneumovax: Not applicable - Prevnar: Not applicable - HPV: Not applicable - Zostavax vaccine: not interested - Covid -- has had 2  SCREENING: -Mammogram: Up to date  - Colonoscopy: Up to date  - Bone Density: Not applicable  -Hearing Test: Not applicable  -Spirometry: Not applicable   PATIENT COUNSELING:   Advised to take 1 mg of folate supplement per day if capable of pregnancy.   Sexuality: Discussed sexually transmitted diseases, partner selection, use of condoms, avoidance of unintended pregnancy  and contraceptive alternatives.   Advised to avoid cigarette smoking.  I discussed with the patient that most people either abstain from alcohol or drink within safe limits (<=14/week and <=4 drinks/occasion for males, <=7/weeks and <= 3 drinks/occasion for females) and that the risk for alcohol disorders and other health effects rises proportionally with the number of drinks per week and how often a drinker exceeds daily limits.  Discussed cessation/primary prevention of drug use and availability of treatment for abuse.   Diet: Encouraged to adjust caloric intake to maintain  or achieve ideal body weight, to reduce intake of dietary saturated fat and total fat, to limit sodium intake by avoiding high sodium foods and not adding table salt, and to maintain adequate dietary potassium and calcium preferably from fresh fruits, vegetables, and low-fat dairy products.    Stressed the importance of regular exercise  Injury prevention: Discussed safety belts, safety helmets, smoke detector, smoking near bedding or upholstery.   Dental health: Discussed importance of regular tooth brushing, flossing, and dental visits.    NEXT PREVENTATIVE PHYSICAL DUE IN 1 YEAR. Return in about 6 months (around 07/16/2021) for HTN/HLD, GERD, PREDIABETES.

## 2021-01-16 NOTE — Assessment & Plan Note (Signed)
Recheck levels today.  Recent have been stable. 

## 2021-01-16 NOTE — Assessment & Plan Note (Signed)
BMI 31.58. Recommended eating smaller high protein, low fat meals more frequently and exercising 30 mins a day 5 times a week with a goal of 10-15lb weight loss in the next 3 months. Patient voiced their understanding and motivation to adhere to these recommendations.  

## 2021-01-16 NOTE — Patient Instructions (Signed)

## 2021-01-16 NOTE — Assessment & Plan Note (Signed)
Chronic, stable. Continue current medication regimen and adjust as needed.  Mag level today. 

## 2021-01-16 NOTE — Assessment & Plan Note (Signed)
Followed by neurology, continue 100% use of CPAP. 

## 2021-01-16 NOTE — Assessment & Plan Note (Signed)
Chronic, stable with BP at goal today.  Recommend she monitor BP at least a few mornings a week at home and document.  DASH diet at home.  Continue current medication regimen and adjust as needed.  Labs today: CMP, urine ALB, TSH, CBC.  Return in 6 months for follow-up.

## 2021-01-16 NOTE — Assessment & Plan Note (Signed)
Chronic, ongoing.  Continue current medication regimen.  Lipid panel today and adjust statin as needed. 

## 2021-01-16 NOTE — Assessment & Plan Note (Signed)
Ongoing, continue supplement and adjust dosing as needed.  Vit D level today. 

## 2021-01-16 NOTE — Assessment & Plan Note (Signed)
History of surgery -- parotidectomy right side.  Follows yearly with dermatology and they monitor.  Has some scar tissue remaining to area. 

## 2021-01-16 NOTE — Assessment & Plan Note (Signed)
Ongoing and stable.  Praised for success with diet and exercise.  Continue this focus and recheck A1C today, if stable check Q6MOS to annually. 

## 2021-01-17 ENCOUNTER — Other Ambulatory Visit: Payer: Self-pay | Admitting: Nurse Practitioner

## 2021-01-17 DIAGNOSIS — D729 Disorder of white blood cells, unspecified: Secondary | ICD-10-CM

## 2021-01-17 LAB — COMPREHENSIVE METABOLIC PANEL
ALT: 28 IU/L (ref 0–32)
AST: 21 IU/L (ref 0–40)
Albumin/Globulin Ratio: 2.8 — ABNORMAL HIGH (ref 1.2–2.2)
Albumin: 5.1 g/dL — ABNORMAL HIGH (ref 3.8–4.9)
Alkaline Phosphatase: 89 IU/L (ref 44–121)
BUN/Creatinine Ratio: 16 (ref 9–23)
BUN: 12 mg/dL (ref 6–24)
Bilirubin Total: 0.2 mg/dL (ref 0.0–1.2)
CO2: 25 mmol/L (ref 20–29)
Calcium: 9.9 mg/dL (ref 8.7–10.2)
Chloride: 100 mmol/L (ref 96–106)
Creatinine, Ser: 0.75 mg/dL (ref 0.57–1.00)
Globulin, Total: 1.8 g/dL (ref 1.5–4.5)
Glucose: 90 mg/dL (ref 70–99)
Potassium: 4 mmol/L (ref 3.5–5.2)
Sodium: 144 mmol/L (ref 134–144)
Total Protein: 6.9 g/dL (ref 6.0–8.5)
eGFR: 92 mL/min/{1.73_m2} (ref >59)

## 2021-01-17 LAB — CBC WITH DIFFERENTIAL/PLATELET
Basophils Absolute: 0.1 10*3/uL (ref 0.0–0.2)
Basos: 0 %
EOS (ABSOLUTE): 0.2 10*3/uL (ref 0.0–0.4)
Eos: 1 %
Hematocrit: 45.8 % (ref 34.0–46.6)
Hemoglobin: 14.9 g/dL (ref 11.1–15.9)
Immature Grans (Abs): 0.1 10*3/uL (ref 0.0–0.1)
Immature Granulocytes: 1 %
Lymphocytes Absolute: 3 10*3/uL (ref 0.7–3.1)
Lymphs: 23 %
MCH: 30.4 pg (ref 26.6–33.0)
MCHC: 32.5 g/dL (ref 31.5–35.7)
MCV: 94 fL (ref 79–97)
Monocytes Absolute: 0.8 10*3/uL (ref 0.1–0.9)
Monocytes: 6 %
Neutrophils Absolute: 8.8 10*3/uL — ABNORMAL HIGH (ref 1.4–7.0)
Neutrophils: 69 %
Platelets: 376 10*3/uL (ref 150–450)
RBC: 4.9 x10E6/uL (ref 3.77–5.28)
RDW: 12.2 % (ref 11.7–15.4)
WBC: 12.8 10*3/uL — ABNORMAL HIGH (ref 3.4–10.8)

## 2021-01-17 LAB — TSH: TSH: 1.03 u[IU]/mL (ref 0.450–4.500)

## 2021-01-17 LAB — T4, FREE: Free T4: 1.19 ng/dL (ref 0.82–1.77)

## 2021-01-17 LAB — LIPID PANEL W/O CHOL/HDL RATIO
Cholesterol, Total: 170 mg/dL (ref 100–199)
HDL: 58 mg/dL (ref >39)
LDL Chol Calc (NIH): 82 mg/dL (ref 0–99)
Triglycerides: 179 mg/dL — ABNORMAL HIGH (ref 0–149)
VLDL Cholesterol Cal: 30 mg/dL (ref 5–40)

## 2021-01-17 LAB — MAGNESIUM: Magnesium: 2.3 mg/dL (ref 1.6–2.3)

## 2021-01-17 LAB — VITAMIN D 25 HYDROXY (VIT D DEFICIENCY, FRACTURES): Vit D, 25-Hydroxy: 31.4 ng/mL (ref 30.0–100.0)

## 2021-01-17 NOTE — Progress Notes (Signed)
Contacted via MyChart -- needs 6 week lab only visit please   Good evening Carrie Arroyo, your labs have returned.  Overall everything looks great with exception of white blood cell count and neutrophils which look at infection.  These have been mildly elevated on past labs on occasion too.  I would like you to return in 6 weeks for lab visit only please.  I will have staff call you schedule this and we will recheck.  Continue all current medications.  Your recent Prednisone treatment can push this up too and may be cause.  Any questions? Keep being stellar!!  Thank you for allowing me to participate in your care.  I appreciate you. Kindest regards, Shawanna Zanders

## 2021-01-18 NOTE — Progress Notes (Signed)
Lmom for pt to schedule lab only visit.

## 2021-01-21 NOTE — Progress Notes (Signed)
Lab appointment scheduled 

## 2021-02-11 DIAGNOSIS — G4733 Obstructive sleep apnea (adult) (pediatric): Secondary | ICD-10-CM | POA: Diagnosis not present

## 2021-02-12 DIAGNOSIS — G4733 Obstructive sleep apnea (adult) (pediatric): Secondary | ICD-10-CM | POA: Diagnosis not present

## 2021-02-18 DIAGNOSIS — L853 Xerosis cutis: Secondary | ICD-10-CM | POA: Diagnosis not present

## 2021-02-18 DIAGNOSIS — L821 Other seborrheic keratosis: Secondary | ICD-10-CM | POA: Diagnosis not present

## 2021-02-18 DIAGNOSIS — Z08 Encounter for follow-up examination after completed treatment for malignant neoplasm: Secondary | ICD-10-CM | POA: Diagnosis not present

## 2021-02-18 DIAGNOSIS — D485 Neoplasm of uncertain behavior of skin: Secondary | ICD-10-CM | POA: Diagnosis not present

## 2021-02-18 DIAGNOSIS — L732 Hidradenitis suppurativa: Secondary | ICD-10-CM | POA: Diagnosis not present

## 2021-02-18 DIAGNOSIS — L570 Actinic keratosis: Secondary | ICD-10-CM | POA: Diagnosis not present

## 2021-02-18 DIAGNOSIS — X32XXXA Exposure to sunlight, initial encounter: Secondary | ICD-10-CM | POA: Diagnosis not present

## 2021-02-21 ENCOUNTER — Other Ambulatory Visit: Payer: Self-pay

## 2021-02-21 ENCOUNTER — Other Ambulatory Visit: Payer: BC Managed Care – PPO

## 2021-02-21 DIAGNOSIS — D729 Disorder of white blood cells, unspecified: Secondary | ICD-10-CM

## 2021-02-22 LAB — CBC WITH DIFFERENTIAL/PLATELET
Basophils Absolute: 0 10*3/uL (ref 0.0–0.2)
Basos: 0 %
EOS (ABSOLUTE): 0.2 10*3/uL (ref 0.0–0.4)
Eos: 2 %
Hematocrit: 43.4 % (ref 34.0–46.6)
Hemoglobin: 14.9 g/dL (ref 11.1–15.9)
Immature Grans (Abs): 0 10*3/uL (ref 0.0–0.1)
Immature Granulocytes: 0 %
Lymphocytes Absolute: 3.6 10*3/uL — ABNORMAL HIGH (ref 0.7–3.1)
Lymphs: 37 %
MCH: 32 pg (ref 26.6–33.0)
MCHC: 34.3 g/dL (ref 31.5–35.7)
MCV: 93 fL (ref 79–97)
Monocytes Absolute: 1 10*3/uL — ABNORMAL HIGH (ref 0.1–0.9)
Monocytes: 11 %
Neutrophils Absolute: 4.8 10*3/uL (ref 1.4–7.0)
Neutrophils: 50 %
Platelets: 369 10*3/uL (ref 150–450)
RBC: 4.66 x10E6/uL (ref 3.77–5.28)
RDW: 12.4 % (ref 11.7–15.4)
WBC: 9.6 10*3/uL (ref 3.4–10.8)

## 2021-02-22 LAB — C-REACTIVE PROTEIN: CRP: 3 mg/L (ref 0–10)

## 2021-02-22 LAB — SEDIMENTATION RATE: Sed Rate: 2 mm/hr (ref 0–40)

## 2021-02-22 NOTE — Progress Notes (Signed)
Contacted via [place] morning Carrie Arroyo, your labs have returned.  White blood cell count is trending down, now in normal range.  Neutrophils also normal now.  Lymphocytes mild elevation, but we can recheck these at next visit.  Inflammatory markers, ESR and CRP, are normal.  Any questions?  Merry Christmas!! Keep being amazing!!  Thank you for allowing me to participate in your care.  I appreciate you. Kindest regards, Claretta Kendra

## 2021-02-28 ENCOUNTER — Other Ambulatory Visit: Payer: BC Managed Care – PPO

## 2021-02-28 DIAGNOSIS — Z9989 Dependence on other enabling machines and devices: Secondary | ICD-10-CM | POA: Diagnosis not present

## 2021-02-28 DIAGNOSIS — G4733 Obstructive sleep apnea (adult) (pediatric): Secondary | ICD-10-CM | POA: Diagnosis not present

## 2021-03-15 DIAGNOSIS — G4733 Obstructive sleep apnea (adult) (pediatric): Secondary | ICD-10-CM | POA: Diagnosis not present

## 2021-03-25 ENCOUNTER — Telehealth: Payer: BC Managed Care – PPO | Admitting: Nurse Practitioner

## 2021-03-25 DIAGNOSIS — N3000 Acute cystitis without hematuria: Secondary | ICD-10-CM | POA: Diagnosis not present

## 2021-03-25 MED ORDER — NITROFURANTOIN MONOHYD MACRO 100 MG PO CAPS: 100.0000 mg | ORAL_CAPSULE | Freq: Two times a day (BID) | ORAL | 0 refills | Status: AC

## 2021-03-25 NOTE — Progress Notes (Signed)
E-Visit for Urinary Problems ° °We are sorry that you are not feeling well.  Here is how we plan to help! ° °Based on what you shared with me it looks like you most likely have a simple urinary tract infection. ° °A UTI (Urinary Tract Infection) is a bacterial infection of the bladder. ° °Most cases of urinary tract infections are simple to treat but a key part of your care is to encourage you to drink plenty of fluids and watch your symptoms carefully. ° °I have prescribed MacroBid 100 mg twice a day for 5 days.  Your symptoms should gradually improve. Call us if the burning in your urine worsens, you develop worsening fever, back pain or pelvic pain or if your symptoms do not resolve after completing the antibiotic. ° °Urinary tract infections can be prevented by drinking plenty of water to keep your body hydrated.  Also be sure when you wipe, wipe from front to back and don't hold it in!  If possible, empty your bladder every 4 hours. ° °HOME CARE °Drink plenty of fluids °Compete the full course of the antibiotics even if the symptoms resolve °Remember, when you need to go…go. Holding in your urine can increase the likelihood of getting a UTI! °GET HELP RIGHT AWAY IF: °You cannot urinate °You get a high fever °Worsening back pain occurs °You see blood in your urine °You feel sick to your stomach or throw up °You feel like you are going to pass out ° °MAKE SURE YOU  °Understand these instructions. °Will watch your condition. °Will get help right away if you are not doing well or get worse. ° ° °Thank you for choosing an e-visit. ° °Your e-visit answers were reviewed by a board certified advanced clinical practitioner to complete your personal care plan. Depending upon the condition, your plan could have included both over the counter or prescription medications. ° °Please review your pharmacy choice. Make sure the pharmacy is open so you can pick up prescription now. If there is a problem, you may contact your  provider through MyChart messaging and have the prescription routed to another pharmacy.  Your safety is important to us. If you have drug allergies check your prescription carefully.  ° °For the next 24 hours you can use MyChart to ask questions about today's visit, request a non-urgent call back, or ask for a work or school excuse. °You will get an email in the next two days asking about your experience. I hope that your e-visit has been valuable and will speed your recovery.  ° °I spent approximately 7 minutes reviewing the patient's history, current symptoms and coordinating their plan of care today.   ° °Meds ordered this encounter  °Medications  ° nitrofurantoin, macrocrystal-monohydrate, (MACROBID) 100 MG capsule  °  Sig: Take 1 capsule (100 mg total) by mouth 2 (two) times daily for 5 days.  °  Dispense:  10 capsule  °  Refill:  0  °  °

## 2021-04-15 DIAGNOSIS — G4733 Obstructive sleep apnea (adult) (pediatric): Secondary | ICD-10-CM | POA: Diagnosis not present

## 2021-05-13 ENCOUNTER — Telehealth: Payer: BC Managed Care – PPO | Admitting: Physician Assistant

## 2021-05-13 DIAGNOSIS — R3989 Other symptoms and signs involving the genitourinary system: Secondary | ICD-10-CM | POA: Diagnosis not present

## 2021-05-13 MED ORDER — CEPHALEXIN 500 MG PO CAPS: 500.0000 mg | ORAL_CAPSULE | Freq: Two times a day (BID) | ORAL | 0 refills | Status: DC

## 2021-05-13 NOTE — Progress Notes (Signed)

## 2021-07-14 NOTE — Patient Instructions (Signed)

## 2021-07-16 ENCOUNTER — Ambulatory Visit: Payer: BC Managed Care – PPO | Admitting: Nurse Practitioner

## 2021-07-16 ENCOUNTER — Encounter: Payer: Self-pay | Admitting: Nurse Practitioner

## 2021-07-16 VITALS — BP 127/80 | HR 78 | Temp 98.1°F | Ht 65.0 in | Wt 187.2 lb

## 2021-07-16 DIAGNOSIS — R7303 Prediabetes: Secondary | ICD-10-CM | POA: Diagnosis not present

## 2021-07-16 DIAGNOSIS — E7800 Pure hypercholesterolemia, unspecified: Secondary | ICD-10-CM | POA: Diagnosis not present

## 2021-07-16 DIAGNOSIS — I10 Essential (primary) hypertension: Secondary | ICD-10-CM

## 2021-07-16 DIAGNOSIS — G4733 Obstructive sleep apnea (adult) (pediatric): Secondary | ICD-10-CM

## 2021-07-16 DIAGNOSIS — K219 Gastro-esophageal reflux disease without esophagitis: Secondary | ICD-10-CM

## 2021-07-16 DIAGNOSIS — Z6831 Body mass index (BMI) 31.0-31.9, adult: Secondary | ICD-10-CM

## 2021-07-16 DIAGNOSIS — E6609 Other obesity due to excess calories: Secondary | ICD-10-CM

## 2021-07-16 DIAGNOSIS — Z9989 Dependence on other enabling machines and devices: Secondary | ICD-10-CM

## 2021-07-16 LAB — MICROALBUMIN, URINE WAIVED
Creatinine, Urine Waived: 50 mg/dL (ref 10–300)
Microalb, Ur Waived: 10 mg/L (ref 0–19)
Microalb/Creat Ratio: <30 mg/g (ref <30)

## 2021-07-16 LAB — BAYER DCA HB A1C WAIVED: HB A1C (BAYER DCA - WAIVED): 5.7 % — ABNORMAL HIGH (ref 4.8–5.6)

## 2021-07-16 NOTE — Assessment & Plan Note (Signed)
Ongoing and stable.  Praised for success with diet and exercise.  Continue this focus and recheck A1C today, if stable check Q6MOS to annually. 

## 2021-07-16 NOTE — Assessment & Plan Note (Signed)
BMI 31.15.  Recommended eating smaller high protein, low fat meals more frequently and exercising 30 mins a day 5 times a week with a goal of 10-15lb weight loss in the next 3 months. Patient voiced their understanding and motivation to adhere to these recommendations.  

## 2021-07-16 NOTE — Progress Notes (Signed)
? ?BP 127/80   Pulse 78   Temp 98.1 ?F (36.7 ?C) (Oral)   Ht _0  (1.651 m)   Wt 187 lb 3.2 oz (84.9 kg)   LMP  (LMP Unknown)   SpO2 98%   BMI 31.15 kg/m?   ? ?Subjective:  ? ? Patient ID: Carrie Arroyo, female    DOB: [birth year], 61 y.o.   MRN: [id number] ? ?HPI: ?Carrie Arroyo is a 61 y.o. female ? ?Chief Complaint  ?Patient presents with  ? Hyperlipidemia  ? Hypertension  ? Gastroesophageal Reflux  ? Prediabetes  ? ?HYPERTENSION / HYPERLIPIDEMIA ?Continues on Benazepril, Simvastatin, ASA.  Former smoker, just occasional alcohol use.  Has CPAP she uses, every night 100%. ?Satisfied with current treatment? yes ?Duration of hypertension: chronic ?BP monitoring frequency: twice a week ?BP range: <120/80 range ?BP medication side effects: no ?Duration of hyperlipidemia: chronic ?Cholesterol medication side effects: no ?Cholesterol supplements: none ?Medication compliance: good compliance ?Aspirin: yes ?Recent stressors: no ?Recurrent headaches: no ?Visual changes: no ?Palpitations: no ?Dyspnea: no ?Chest pain: no ?Lower extremity edema: occasional at end of day ?Dizzy/lightheaded: no  ? ?PREDIABETES: ?Last A1c in November 5.6%.   ?Hypoglycemic episodes:no ?Polydipsia/polyuria: no ?Visual disturbance: no ?Chest pain: no ?Paresthesias: no  ? ?GERD ?Continues on Prilosec, generic.   ?GERD control status: stable  ?Satisfied with current treatment? yes ?Heartburn frequency: none ?Medication side effects: no  ?Medication compliance: stable ?Previous GERD medications: TUMS ?Antacid use frequency:  none  ?Dysphagia: no ?Odynophagia:  no ?Hematemesis: no ?Blood in stool: no ?EGD: yes ? ?Relevant past medical, surgical, family and social history reviewed and updated as indicated. Interim medical history since our last visit reviewed. ?Allergies and medications reviewed and updated. ? ?Review of Systems  ?Constitutional:  Negative for activity change, appetite change, diaphoresis, fatigue and fever.  ?Respiratory:   Negative for cough, chest tightness and shortness of breath.   ?Cardiovascular:  Negative for chest pain, palpitations and leg swelling.  ?Gastrointestinal: Negative.   ?Endocrine: Negative for cold intolerance, heat intolerance, polydipsia, polyphagia and polyuria.  ?Neurological: Negative.   ?Psychiatric/Behavioral: Negative.    ? ?Per HPI unless specifically indicated above ? ?   ?Objective:  ?  ?BP 127/80   Pulse 78   Temp 98.1 ?F (36.7 ?C) (Oral)   Ht _1  (1.651 m)   Wt 187 lb 3.2 oz (84.9 kg)   LMP  (LMP Unknown)   SpO2 98%   BMI 31.15 kg/m?   ?Wt Readings from Last 3 Encounters:  ?07/16/21 187 lb 3.2 oz (84.9 kg)  ?01/16/21 189 lb 12.8 oz (86.1 kg)  ?01/08/21 186 lb 3.2 oz (84.5 kg)  ?  ?Physical Exam ?Vitals and nursing note reviewed.  ?Constitutional:   ?   General: She is awake. She is not in acute distress. ?   Appearance: She is well-developed and overweight. She is not ill-appearing.  ?HENT:  ?   Head: Normocephalic.  ?   Right Ear: Hearing normal.  ?   Left Ear: Hearing normal.  ?Eyes:  ?   General: Lids are normal.     ?   Right eye: No discharge.     ?   Left eye: No discharge.  ?   Conjunctiva/sclera: Conjunctivae normal.  ?   Pupils: Pupils are equal, round, and reactive to light.  ?Neck:  ?   Thyroid: No thyromegaly.  ?   Vascular: No carotid bruit.  ?Cardiovascular:  ?   Rate and Rhythm: Normal rate  and regular rhythm.  ?   Heart sounds: Normal heart sounds. No murmur heard. ?  No gallop.  ?Pulmonary:  ?   Effort: Pulmonary effort is normal.  ?   Breath sounds: Normal breath sounds.  ?Abdominal:  ?   General: Bowel sounds are normal.  ?   Palpations: Abdomen is soft. There is no hepatomegaly or splenomegaly.  ?Musculoskeletal:  ?   Cervical back: Normal range of motion and neck supple.  ?   Right lower leg: No edema.  ?   Left lower leg: No edema.  ?Skin: ?   General: Skin is warm and dry.  ?Neurological:  ?   Mental Status: She is alert and oriented to person, place, and time.   ?Psychiatric:     ?   Attention and Perception: Attention normal.     ?   Mood and Affect: Mood normal.     ?   Behavior: Behavior normal. Behavior is cooperative.     ?   Thought Content: Thought content normal.     ?   Judgment: Judgment normal.  ? ? ?Results for orders placed or performed in visit on 02/21/21  ?Sed Rate (ESR)  ?Result Value Ref Range  ? Sed Rate 2 0 - 40 mm/hr  ?C-reactive protein  ?Result Value Ref Range  ? CRP 3 0 - 10 mg/L  ?CBC with Differential/Platelet  ?Result Value Ref Range  ? WBC 9.6 3.4 - 10.8 x10E3/uL  ? RBC 4.66 3.77 - 5.28 x10E6/uL  ? Hemoglobin 14.9 11.1 - 15.9 g/dL  ? Hematocrit 43.4 34.0 - 46.6 %  ? MCV 93 79 - 97 fL  ? MCH 32.0 26.6 - 33.0 pg  ? MCHC 34.3 31.5 - 35.7 g/dL  ? RDW 12.4 11.7 - 15.4 %  ? Platelets 369 150 - 450 x10E3/uL  ? Neutrophils 50 Not Estab. %  ? Lymphs 37 Not Estab. %  ? Monocytes 11 Not Estab. %  ? Eos 2 Not Estab. %  ? Basos 0 Not Estab. %  ? Neutrophils Absolute 4.8 1.4 - 7.0 x10E3/uL  ? Lymphocytes Absolute 3.6 (H) 0.7 - 3.1 x10E3/uL  ? Monocytes Absolute 1.0 (H) 0.1 - 0.9 x10E3/uL  ? EOS (ABSOLUTE) 0.2 0.0 - 0.4 x10E3/uL  ? Basophils Absolute 0.0 0.0 - 0.2 x10E3/uL  ? Immature Granulocytes 0 Not Estab. %  ? Immature Grans (Abs) 0.0 0.0 - 0.1 x10E3/uL  ? ?   ?Assessment & Plan:  ? ?Problem List Items Addressed This Visit   ? ?  ? Cardiovascular and Mediastinum  ? Hypertension - Primary  ? Relevant Orders  ? Microalbumin, Urine Waived  ? Basic metabolic panel  ?  ? Respiratory  ? OSA on CPAP  ?  ? Other  ? Hyperlipidemia  ? Relevant Orders  ? Lipid Panel w/o Chol/HDL Ratio  ? Obesity  ? Prediabetes  ? Relevant Orders  ? Bayer DCA Hb A1c Waived  ? Microalbumin, Urine Waived  ?  ? ?Follow up plan: ?No follow-ups on file. ?

## 2021-07-16 NOTE — Assessment & Plan Note (Signed)
Chronic, ongoing.  Continue current medication regimen.  Lipid panel today and adjust statin as needed. 

## 2021-07-16 NOTE — Assessment & Plan Note (Signed)
Chronic, stable with BP at goal today.  Recommend she monitor BP at least a few mornings a week at home and document.  DASH diet at home.  Continue current medication regimen and adjust as needed.  Labs today: BMP and urine ALB.  Return in 6 months for physical. ? ?

## 2021-07-16 NOTE — Assessment & Plan Note (Signed)
Followed by neurology, continue 100% use of CPAP. 

## 2021-07-16 NOTE — Assessment & Plan Note (Signed)
Chronic, stable.  Continue current medication regimen and adjust as needed.  Mag level next visit. 

## 2021-07-17 DIAGNOSIS — G4733 Obstructive sleep apnea (adult) (pediatric): Secondary | ICD-10-CM | POA: Diagnosis not present

## 2021-07-17 LAB — BASIC METABOLIC PANEL
BUN/Creatinine Ratio: 18 (ref 12–28)
BUN: 12 mg/dL (ref 8–27)
CO2: 23 mmol/L (ref 20–29)
Calcium: 10 mg/dL (ref 8.7–10.3)
Chloride: 101 mmol/L (ref 96–106)
Creatinine, Ser: 0.67 mg/dL (ref 0.57–1.00)
Glucose: 93 mg/dL (ref 70–99)
Potassium: 4.6 mmol/L (ref 3.5–5.2)
Sodium: 140 mmol/L (ref 134–144)
eGFR: 100 mL/min/{1.73_m2} (ref >59)

## 2021-07-17 LAB — LIPID PANEL W/O CHOL/HDL RATIO
Cholesterol, Total: 175 mg/dL (ref 100–199)
HDL: 46 mg/dL (ref >39)
LDL Chol Calc (NIH): 99 mg/dL (ref 0–99)
Triglycerides: 172 mg/dL — ABNORMAL HIGH (ref 0–149)
VLDL Cholesterol Cal: 30 mg/dL (ref 5–40)

## 2021-07-17 NOTE — Progress Notes (Signed)
Contacted via [facility] ? ? ?Good afternoon Carrie Arroyo, your labs have returned and overall remain stable.  No medication changes needed at this time.  Any questions? ?Keep being stellar!!  Thank you for allowing me to participate in your care.  I appreciate you. ?Kindest regards, ?Elston Aldape ?

## 2021-08-17 DIAGNOSIS — G4733 Obstructive sleep apnea (adult) (pediatric): Secondary | ICD-10-CM | POA: Diagnosis not present

## 2021-09-16 DIAGNOSIS — G4733 Obstructive sleep apnea (adult) (pediatric): Secondary | ICD-10-CM | POA: Diagnosis not present

## 2021-10-09 ENCOUNTER — Telehealth: Payer: BC Managed Care – PPO | Admitting: Physician Assistant

## 2021-10-09 DIAGNOSIS — R3989 Other symptoms and signs involving the genitourinary system: Secondary | ICD-10-CM | POA: Diagnosis not present

## 2021-10-09 MED ORDER — NITROFURANTOIN MONOHYD MACRO 100 MG PO CAPS
100.0000 mg | ORAL_CAPSULE | Freq: Two times a day (BID) | ORAL | 0 refills | Status: DC
Start: 2021-10-09 — End: 2022-01-18

## 2021-10-09 NOTE — Progress Notes (Signed)

## 2021-11-01 DIAGNOSIS — N390 Urinary tract infection, site not specified: Secondary | ICD-10-CM | POA: Diagnosis not present

## 2021-11-01 DIAGNOSIS — R300 Dysuria: Secondary | ICD-10-CM | POA: Diagnosis not present

## 2021-11-15 ENCOUNTER — Encounter: Payer: Self-pay | Admitting: Nurse Practitioner

## 2021-11-25 DIAGNOSIS — Z1231 Encounter for screening mammogram for malignant neoplasm of breast: Secondary | ICD-10-CM | POA: Diagnosis not present

## 2021-11-25 LAB — HM MAMMOGRAPHY

## 2021-11-27 ENCOUNTER — Encounter: Payer: Self-pay | Admitting: Nurse Practitioner

## 2021-12-04 DIAGNOSIS — G4733 Obstructive sleep apnea (adult) (pediatric): Secondary | ICD-10-CM | POA: Diagnosis not present

## 2022-01-04 DIAGNOSIS — G4733 Obstructive sleep apnea (adult) (pediatric): Secondary | ICD-10-CM | POA: Diagnosis not present

## 2022-01-08 DIAGNOSIS — J301 Allergic rhinitis due to pollen: Secondary | ICD-10-CM | POA: Diagnosis not present

## 2022-01-08 DIAGNOSIS — D110 Benign neoplasm of parotid gland: Secondary | ICD-10-CM | POA: Diagnosis not present

## 2022-01-18 NOTE — Patient Instructions (Signed)

## 2022-01-20 ENCOUNTER — Encounter: Payer: Self-pay | Admitting: Nurse Practitioner

## 2022-01-20 ENCOUNTER — Ambulatory Visit (INDEPENDENT_AMBULATORY_CARE_PROVIDER_SITE_OTHER): Payer: BC Managed Care – PPO | Admitting: Nurse Practitioner

## 2022-01-20 VITALS — BP 135/82 | HR 96 | Temp 98.4°F | Ht 65.0 in | Wt 188.4 lb

## 2022-01-20 DIAGNOSIS — E7800 Pure hypercholesterolemia, unspecified: Secondary | ICD-10-CM | POA: Diagnosis not present

## 2022-01-20 DIAGNOSIS — I10 Essential (primary) hypertension: Secondary | ICD-10-CM | POA: Diagnosis not present

## 2022-01-20 DIAGNOSIS — R7989 Other specified abnormal findings of blood chemistry: Secondary | ICD-10-CM

## 2022-01-20 DIAGNOSIS — Z0000 Encounter for general adult medical examination without abnormal findings: Secondary | ICD-10-CM

## 2022-01-20 DIAGNOSIS — E559 Vitamin D deficiency, unspecified: Secondary | ICD-10-CM | POA: Diagnosis not present

## 2022-01-20 DIAGNOSIS — E6609 Other obesity due to excess calories: Secondary | ICD-10-CM

## 2022-01-20 DIAGNOSIS — K219 Gastro-esophageal reflux disease without esophagitis: Secondary | ICD-10-CM

## 2022-01-20 DIAGNOSIS — Z6831 Body mass index (BMI) 31.0-31.9, adult: Secondary | ICD-10-CM

## 2022-01-20 DIAGNOSIS — R7303 Prediabetes: Secondary | ICD-10-CM

## 2022-01-20 DIAGNOSIS — G4733 Obstructive sleep apnea (adult) (pediatric): Secondary | ICD-10-CM

## 2022-01-20 DIAGNOSIS — E66811 Obesity, class 1: Secondary | ICD-10-CM

## 2022-01-20 DIAGNOSIS — E348 Other specified endocrine disorders: Secondary | ICD-10-CM

## 2022-01-20 MED ORDER — SIMVASTATIN 40 MG PO TABS: ORAL_TABLET | ORAL | 4 refills | Status: DC

## 2022-01-20 MED ORDER — BENAZEPRIL HCL 20 MG PO TABS: 20.0000 mg | ORAL_TABLET | Freq: Every day | ORAL | 4 refills | Status: DC

## 2022-01-20 NOTE — Assessment & Plan Note (Signed)
History of surgery -- parotidectomy right side.  Follows yearly with dermatology and they monitor.  Has some scar tissue remaining to area.

## 2022-01-20 NOTE — Assessment & Plan Note (Signed)
Ongoing, continue supplement and adjust dosing as needed.  Vit D level today.

## 2022-01-20 NOTE — Assessment & Plan Note (Signed)
Recheck levels today.  Recent have been stable.

## 2022-01-20 NOTE — Assessment & Plan Note (Signed)
Followed by neurology, continue 100% use of CPAP.

## 2022-01-20 NOTE — Assessment & Plan Note (Signed)
BMI 31.35.  Recommended eating smaller high protein, low fat meals more frequently and exercising 30 mins a day 5 times a week with a goal of 10-15lb weight loss in the next 3 months. Patient voiced their understanding and motivation to adhere to these recommendations.  

## 2022-01-20 NOTE — Assessment & Plan Note (Signed)
Chronic, stable.  BP at goal, especially on home readings well below goal.  Recommend she monitor BP at least a few mornings a week at home and document.  DASH diet at home.  Continue current medication regimen and adjust as needed.  Labs today: CBC, CMP, TSH.  Return in 6 months.  Refills sent in.

## 2022-01-20 NOTE — Progress Notes (Signed)
BP 135/82   Pulse 96   Temp 98.4 F (36.9 C) (Oral)   Ht 5\' 5"  (1.651 m)   Wt 188 lb 6.4 oz (85.5 kg)   LMP  (LMP Unknown)   SpO2 99%   BMI 31.35 kg/m    Subjective:    Patient ID: , female    DOB: 11-10-1960, 61 y.o.   MRN: [id number]  HPI: Carrie Arroyo is a 61 y.o. female presenting on 01/20/2022 for comprehensive medical examination. Current medical complaints include:none  She currently lives with: husband Menopausal Symptoms: no  HYPERTENSION / HYPERLIPIDEMIA Continues on Benazepril, Simvastatin, ASA.  Non smoker (former smoker -- in college years). Last saw neurology on 02/28/21 for OSA and reports using CPAP nightly.  History of Werner's Syndrome. Satisfied with current treatment? yes Duration of hypertension: chronic BP monitoring frequency: once a week BP range: 120/70 range at home BP medication side effects: no Duration of hyperlipidemia: chronic Cholesterol medication side effects: no Cholesterol supplements: none Medication compliance: good compliance Aspirin: yes Recent stressors: no Recurrent headaches: no Visual changes: no Palpitations: no Dyspnea: no Chest pain: no Lower extremity edema: no Dizzy/lightheaded: no  The 10-year ASCVD risk score (Arnett DK, et al., 2019) is: 5.5%   Values used to calculate the score:     Age: 74 years     Sex: Female     Is Non-Hispanic African American: No     Diabetic: No     Tobacco smoker: No     Systolic Blood Pressure: 135 mmHg     Is BP treated: Yes     HDL Cholesterol: 46 mg/dL     Total Cholesterol: 175 mg/dL    PREDIABETES: Last 77 5.7% in May.  History of abnormal thyroid levels, recent have been stable.  Does Biotin.  Polydipsia/polyuria: no Visual disturbance: no Chest pain: no Paresthesias: no    GERD Continues on Prilosec, generic.  Has tried cutting back slowly, but heart burn returns every time she tries this reduction. GERD control status: stable  Satisfied with current  treatment? yes Heartburn frequency: none Medication side effects: no  Medication compliance: stable Previous GERD medications: TUMS Antacid use frequency:  none  Dysphagia: no Odynophagia:  no Hematemesis: no Blood in stool: no EGD: yes   VITAMIN D DEFICIENCY: Taking supplement daily.  Denies any recent falls or fractures.  No increased fatigue or muscle pain.   Depression Screen done today and results listed below:     01/20/2022    8:52 AM 07/16/2021    8:11 AM 01/16/2021    8:20 AM 01/13/2020    7:54 AM 11/16/2018    8:11 AM  Depression screen PHQ 2/9  Decreased Interest 0 0 0 0 0  Down, Depressed, Hopeless 0 0 0 0 0  PHQ - 2 Score 0 0 0 0 0  Altered sleeping  0 0  0  Tired, decreased energy 1 0 0  0  Change in appetite 0 0 0  0  Feeling bad or failure about yourself  0 0 0  0  Trouble concentrating 0 0 0  0  Moving slowly or fidgety/restless 0 0 0  0  Suicidal thoughts 0 0 0  0  PHQ-9 Score  0 0  0  Difficult doing work/chores Not difficult at all Not difficult at all Not difficult at all  Not difficult at all      07/12/2019    8:06 AM 01/13/2020    7:54  AM 10/20/2020    4:57 PM 01/16/2021    8:21 AM 07/16/2021    8:11 AM  Fall Risk  Falls in the past year? 0 0  0 0  Was there an injury with Fall? 0 0  0 0  Fall Risk Category Calculator 0 0  0 0  Fall Risk Category Low Low  Low Low  Patient Fall Risk Level Low fall risk Low fall risk Low fall risk Low fall risk Low fall risk  Patient at Risk for Falls Due to  No Fall Risks  No Fall Risks No Fall Risks  Fall risk Follow up Falls evaluation completed Falls evaluation completed  Education provided Falls evaluation completed    Functional Status Survey: Is the patient deaf or have difficulty hearing?: No Does the patient have difficulty seeing, even when wearing glasses/contacts?: No Does the patient have difficulty concentrating, remembering, or making decisions?: No Does the patient have difficulty walking or  climbing stairs?: No Does the patient have difficulty dressing or bathing?: No Does the patient have difficulty doing errands alone such as visiting a doctor's office or shopping?: No   Past Medical History:  Past Medical History:  Diagnosis Date   Allergy    Boils    Elevated liver enzymes    Humerus fracture    right   Hypertension    Sleep apnea     Surgical History:  Past Surgical History:  Procedure Laterality Date   APPENDECTOMY     DENTAL SURGERY  01/12/2020   OVARIAN CYST REMOVAL     PAROTIDECTOMY     TOTAL ABDOMINAL HYSTERECTOMY      Medications:  Current Outpatient Medications on File Prior to Visit  Medication Sig   aspirin 81 MG tablet Take 81 mg by mouth daily.   Azelastine HCl 137 MCG/SPRAY SOLN SMARTSIG:1-2 Spray(s) Both Nares Twice Daily   Cholecalciferol (VITAMIN D3) 50 MCG (2000 UT) CAPS Take 1 capsule by mouth daily.   clindamycin (CLEOCIN T) 1 % lotion Apply topically.   clobetasol cream (TEMOVATE) 0.05 % as needed.   fluorouracil (EFUDEX) 5 % cream Apply 1 application. topically at bedtime.   fluticasone (FLONASE) 50 MCG/ACT nasal spray Place 2 sprays into both nostrils daily.   loratadine (CLARITIN) 10 MG tablet Take 10 mg by mouth daily.   minocycline (MINOCIN,DYNACIN) 100 MG capsule TAKE ONE CAPSULE BY MOUTH TWICE A DAY FOR FLARES   Multiple Vitamin (MULTIVITAMIN) tablet Take 1 tablet by mouth daily.   omeprazole (PRILOSEC) 20 MG capsule Take 20 mg by mouth daily.   SODIUM FLUORIDE 5000 PPM 1.1 % PSTE Take by mouth at bedtime.   triamcinolone cream (KENALOG) 0.1 % Apply 1 application topically 2 (two) times daily.   No current facility-administered medications on file prior to visit.    Allergies:  Allergies  Allergen Reactions   Penicillin G Benzathine    Sulfa Antibiotics     Social History:  Social History   Socioeconomic History   Marital status: Married    Spouse name: Not on file   Number of children: Not on file   Years of  education: Not on file   Highest education level: Not on file  Occupational History   Not on file  Tobacco Use   Smoking status: Never   Smokeless tobacco: Never  Vaping Use   Vaping Use: Never used  Substance and Sexual Activity   Alcohol use: Yes    Alcohol/week: 2.0 standard drinks of alcohol  Types: 2 Glasses of wine per week   Drug use: No   Sexual activity: Never  Other Topics Concern   Not on file  Social History Narrative   Not on file   Social Determinants of Health   Financial Resource Strain: Low Risk  (01/16/2021)   Overall Financial Resource Strain (CARDIA)    Difficulty of Paying Living Expenses: Not hard at all  Food Insecurity: No Food Insecurity (01/16/2021)   Hunger Vital Sign    Worried About Running Out of Food in the Last Year: Never true    Ran Out of Food in the Last Year: Never true  Transportation Needs: No Transportation Needs (01/16/2021)   PRAPARE - Administrator, Civil Service (Medical): No    Lack of Transportation (Non-Medical): No  Physical Activity: Insufficiently Active (01/16/2021)   Exercise Vital Sign    Days of Exercise per Week: 3 days    Minutes of Exercise per Session: 40 min  Stress: No Stress Concern Present (01/16/2021)   [employer] of Occupational Health - Occupational Stress Questionnaire    Feeling of Stress : Only a little  Social Connections: Moderately Isolated (01/16/2021)   Social Connection and Isolation Panel [NHANES]    Frequency of Communication with Friends and Family: More than three times a week    Frequency of Social Gatherings with Friends and Family: More than three times a week    Attends Religious Services: Never    Database administrator or Organizations: No    Attends Banker Meetings: Never    Marital Status: Married  Catering manager Violence: Not At Risk (01/16/2021)   Humiliation, Afraid, Rape, and Kick questionnaire    Fear of Current or Ex-Partner: No     Emotionally Abused: No    Physically Abused: No    Sexually Abused: No   Social History   Tobacco Use  Smoking Status Never  Smokeless Tobacco Never   Social History   Substance and Sexual Activity  Alcohol Use Yes   Alcohol/week: 2.0 standard drinks of alcohol   Types: 2 Glasses of wine per week    Family History:  Family History  Adopted: Yes  Family history unknown: Yes    Past medical history, surgical history, medications, allergies, family history and social history reviewed with patient today and changes made to appropriate areas of the chart.   Review of Systems - negative All other ROS negative except what is listed above and in the HPI.      Objective:    BP 135/82   Pulse 96   Temp 98.4 F (36.9 C) (Oral)   Ht 5\' 5"  (1.651 m)   Wt 188 lb 6.4 oz (85.5 kg)   LMP  (LMP Unknown)   SpO2 99%   BMI 31.35 kg/m   Wt Readings from Last 3 Encounters:  01/20/22 188 lb 6.4 oz (85.5 kg)  07/16/21 187 lb 3.2 oz (84.9 kg)  01/16/21 189 lb 12.8 oz (86.1 kg)    Physical Exam Vitals and nursing note reviewed.  Constitutional:      General: She is awake. She is not in acute distress.    Appearance: She is well-developed and well-groomed. She is obese. She is not ill-appearing or toxic-appearing.  HENT:     Head: Normocephalic and atraumatic.     Right Ear: Hearing, tympanic membrane, ear canal and external ear normal. No drainage.     Left Ear: Hearing, tympanic membrane, ear canal and  external ear normal. No drainage.     Nose: Nose normal.     Right Sinus: No maxillary sinus tenderness or frontal sinus tenderness.     Left Sinus: No maxillary sinus tenderness or frontal sinus tenderness.     Mouth/Throat:     Mouth: Mucous membranes are moist.     Pharynx: Oropharynx is clear. Uvula midline. No pharyngeal swelling, oropharyngeal exudate or posterior oropharyngeal erythema.  Eyes:     General: Lids are normal.        Right eye: No discharge.        Left eye:  No discharge.     Extraocular Movements: Extraocular movements intact.     Conjunctiva/sclera: Conjunctivae normal.     Pupils: Pupils are equal, round, and reactive to light.     Visual Fields: Right eye visual fields normal and left eye visual fields normal.  Neck:     Thyroid: No thyromegaly.     Vascular: No carotid bruit.     Trachea: Trachea normal.  Cardiovascular:     Rate and Rhythm: Normal rate and regular rhythm.     Heart sounds: Normal heart sounds. No murmur heard.    No gallop.  Pulmonary:     Effort: Pulmonary effort is normal. No accessory muscle usage or respiratory distress.     Breath sounds: Normal breath sounds.  Chest:  Breasts:    Right: Normal.     Left: Normal.  Abdominal:     General: Bowel sounds are normal.     Palpations: Abdomen is soft. There is no hepatomegaly or splenomegaly.     Tenderness: There is no abdominal tenderness.  Musculoskeletal:        General: Normal range of motion.     Cervical back: Normal range of motion and neck supple.     Right lower leg: No edema.     Left lower leg: No edema.  Lymphadenopathy:     Head:     Right side of head: No submental, submandibular, tonsillar, preauricular or posterior auricular adenopathy.     Left side of head: No submental, submandibular, tonsillar, preauricular or posterior auricular adenopathy.     Cervical: No cervical adenopathy.     Upper Body:     Right upper body: No supraclavicular, axillary or pectoral adenopathy.     Left upper body: No supraclavicular, axillary or pectoral adenopathy.  Skin:    General: Skin is warm and dry.     Capillary Refill: Capillary refill takes less than 2 seconds.     Findings: No rash.  Neurological:     Mental Status: She is alert and oriented to person, place, and time.     Gait: Gait is intact.     Deep Tendon Reflexes: Reflexes are normal and symmetric.     Reflex Scores:      Brachioradialis reflexes are 2+ on the right side and 2+ on the left  side.      Patellar reflexes are 2+ on the right side and 2+ on the left side. Psychiatric:        Attention and Perception: Attention normal.        Mood and Affect: Mood normal.        Speech: Speech normal.        Behavior: Behavior normal. Behavior is cooperative.        Thought Content: Thought content normal.        Judgment: Judgment normal.    Results for orders  placed or performed in visit on 11/27/21  HM MAMMOGRAPHY  Result Value Ref Range   HM Mammogram 0-4 Bi-Rad 0-4 Bi-Rad, Self Reported Normal      Assessment & Plan:   Problem List Items Addressed This Visit       Cardiovascular and Mediastinum   Hypertension - Primary    Chronic, stable.  BP at goal, especially on home readings well below goal.  Recommend she monitor BP at least a few mornings a week at home and document.  DASH diet at home.  Continue current medication regimen and adjust as needed.  Labs today: CBC, CMP, TSH.  Return in 6 months.  Refills sent in.       Relevant Medications   benazepril (LOTENSIN) 20 MG tablet   simvastatin (ZOCOR) 40 MG tablet   Other Relevant Orders   Comprehensive metabolic panel   CBC with Differential/Platelet     Respiratory   OSA on CPAP    Followed by neurology, continue 100% use of CPAP.      Relevant Orders   CBC with Differential/Platelet     Digestive   GERD (gastroesophageal reflux disease)    Chronic, stable.  Continue current medication regimen and adjust as needed.  Mag level next visit.  Risks of PPI use were discussed with patient including bone loss, C. Diff diarrhea, pneumonia, infections, CKD, electrolyte abnormalities.  Verbalizes understanding and chooses to continue the medication.       Relevant Orders   Magnesium     Musculoskeletal and Integument   Werner's syndrome    History of surgery -- parotidectomy right side.  Follows yearly with dermatology and they monitor.  Has some scar tissue remaining to area.        Other    Hyperlipidemia    Chronic, ongoing.  Continue current medication regimen.  Lipid panel today and adjust statin as needed.      Relevant Medications   benazepril (LOTENSIN) 20 MG tablet   simvastatin (ZOCOR) 40 MG tablet   Other Relevant Orders   Comprehensive metabolic panel   Lipid Panel w/o Chol/HDL Ratio   Low TSH level    Recheck levels today.  Recent have been stable.      Relevant Orders   T4, free   TSH   Obesity    BMI 31.35.  Recommended eating smaller high protein, low fat meals more frequently and exercising 30 mins a day 5 times a week with a goal of 10-15lb weight loss in the next 3 months. Patient voiced their understanding and motivation to adhere to these recommendations.       Prediabetes    Ongoing and stable.  Praised for success with diet and exercise.  Continue this focus and recheck A1C today, if stable check Q6MOS to annually.      Relevant Orders   Comprehensive metabolic panel   HgB A1c   Vitamin D deficiency    Ongoing, continue supplement and adjust dosing as needed.  Vit D level today.      Relevant Orders   VITAMIN D 25 Hydroxy (Vit-D Deficiency, Fractures)   Other Visit Diagnoses     Encounter for annual physical exam       Annual physical today with labs and health maintenance reviewed, discussed with patient.        Follow up plan: Return in about 6 months (around 07/21/2022) for HTN/HLD, OSA, PREDIABETES.   LABORATORY TESTING:  - Pap smear: not applicable -- hysterectomy in 2000  IMMUNIZATIONS:   - Tdap: Tetanus vaccination status reviewed: last tetanus booster within 10 years. - Influenza: Refused -- does not get these annually - Pneumovax: Not applicable - Prevnar: Not applicable - HPV: Not applicable - Zostavax vaccine: not interested -- refuses - Covid -- has had 2 vaccinations  SCREENING: -Mammogram: Up to date on 11/25/21 - Colonoscopy: Refuses -- had one at age 72, will obtain records - Bone Density: Not applicable  obtain at age 59 -Hearing Test: Not applicable  -Spirometry: Not applicable   PATIENT COUNSELING:   Advised to take 1 mg of folate supplement per day if capable of pregnancy.   Sexuality: Discussed sexually transmitted diseases, partner selection, use of condoms, avoidance of unintended pregnancy  and contraceptive alternatives.   Advised to avoid cigarette smoking.  I discussed with the patient that most people either abstain from alcohol or drink within safe limits (<=14/week and <=4 drinks/occasion for males, <=7/weeks and <= 3 drinks/occasion for females) and that the risk for alcohol disorders and other health effects rises proportionally with the number of drinks per week and how often a drinker exceeds daily limits.  Discussed cessation/primary prevention of drug use and availability of treatment for abuse.   Diet: Encouraged to adjust caloric intake to maintain  or achieve ideal body weight, to reduce intake of dietary saturated fat and total fat, to limit sodium intake by avoiding high sodium foods and not adding table salt, and to maintain adequate dietary potassium and calcium preferably from fresh fruits, vegetables, and low-fat dairy products.    Stressed the importance of regular exercise  Injury prevention: Discussed safety belts, safety helmets, smoke detector, smoking near bedding or upholstery.   Dental health: Discussed importance of regular tooth brushing, flossing, and dental visits.    NEXT PREVENTATIVE PHYSICAL DUE IN 1 YEAR. Return in about 6 months (around 07/21/2022) for HTN/HLD, OSA, PREDIABETES.

## 2022-01-20 NOTE — Assessment & Plan Note (Signed)
Chronic, stable.  Continue current medication regimen and adjust as needed.  Mag level next visit.  Risks of PPI use were discussed with patient including bone loss, C. Diff diarrhea, pneumonia, infections, CKD, electrolyte abnormalities.  Verbalizes understanding and chooses to continue the medication.

## 2022-01-20 NOTE — Assessment & Plan Note (Signed)
Chronic, ongoing.  Continue current medication regimen.  Lipid panel today and adjust statin as needed.

## 2022-01-20 NOTE — Assessment & Plan Note (Signed)
Ongoing and stable.  Praised for success with diet and exercise.  Continue this focus and recheck A1C today, if stable check Q6MOS to annually.

## 2022-01-21 LAB — COMPREHENSIVE METABOLIC PANEL
ALT: 23 IU/L (ref 0–32)
AST: 26 IU/L (ref 0–40)
Albumin/Globulin Ratio: 2 (ref 1.2–2.2)
Albumin: 4.8 g/dL (ref 3.9–4.9)
Alkaline Phosphatase: 104 IU/L (ref 44–121)
BUN/Creatinine Ratio: 14 (ref 12–28)
BUN: 10 mg/dL (ref 8–27)
Bilirubin Total: 0.3 mg/dL (ref 0.0–1.2)
CO2: 23 mmol/L (ref 20–29)
Calcium: 10.1 mg/dL (ref 8.7–10.3)
Chloride: 99 mmol/L (ref 96–106)
Creatinine, Ser: 0.73 mg/dL (ref 0.57–1.00)
Globulin, Total: 2.4 g/dL (ref 1.5–4.5)
Glucose: 103 mg/dL — ABNORMAL HIGH (ref 70–99)
Potassium: 4.1 mmol/L (ref 3.5–5.2)
Sodium: 138 mmol/L (ref 134–144)
Total Protein: 7.2 g/dL (ref 6.0–8.5)
eGFR: 94 mL/min/{1.73_m2} (ref >59)

## 2022-01-21 LAB — CBC WITH DIFFERENTIAL/PLATELET
Basophils Absolute: 0.1 10*3/uL (ref 0.0–0.2)
Basos: 1 %
EOS (ABSOLUTE): 0.2 10*3/uL (ref 0.0–0.4)
Eos: 2 %
Hematocrit: 42.5 % (ref 34.0–46.6)
Hemoglobin: 14 g/dL (ref 11.1–15.9)
Immature Grans (Abs): 0 10*3/uL (ref 0.0–0.1)
Immature Granulocytes: 0 %
Lymphocytes Absolute: 3 10*3/uL (ref 0.7–3.1)
Lymphs: 32 %
MCH: 31.2 pg (ref 26.6–33.0)
MCHC: 32.9 g/dL (ref 31.5–35.7)
MCV: 95 fL (ref 79–97)
Monocytes Absolute: 0.8 10*3/uL (ref 0.1–0.9)
Monocytes: 9 %
Neutrophils Absolute: 5.3 10*3/uL (ref 1.4–7.0)
Neutrophils: 56 %
Platelets: 375 10*3/uL (ref 150–450)
RBC: 4.49 x10E6/uL (ref 3.77–5.28)
RDW: 12.2 % (ref 11.7–15.4)
WBC: 9.3 10*3/uL (ref 3.4–10.8)

## 2022-01-21 LAB — HEMOGLOBIN A1C
Est. average glucose Bld gHb Est-mCnc: 126 mg/dL
Hgb A1c MFr Bld: 6 % — ABNORMAL HIGH (ref 4.8–5.6)

## 2022-01-21 LAB — LIPID PANEL W/O CHOL/HDL RATIO
Cholesterol, Total: 158 mg/dL (ref 100–199)
HDL: 49 mg/dL (ref >39)
LDL Chol Calc (NIH): 87 mg/dL (ref 0–99)
Triglycerides: 121 mg/dL (ref 0–149)
VLDL Cholesterol Cal: 22 mg/dL (ref 5–40)

## 2022-01-21 LAB — TSH: TSH: 1.4 u[IU]/mL (ref 0.450–4.500)

## 2022-01-21 LAB — T4, FREE: Free T4: 1.05 ng/dL (ref 0.82–1.77)

## 2022-01-21 LAB — MAGNESIUM: Magnesium: 2.1 mg/dL (ref 1.6–2.3)

## 2022-01-21 LAB — VITAMIN D 25 HYDROXY (VIT D DEFICIENCY, FRACTURES): Vit D, 25-Hydroxy: 35.9 ng/mL (ref 30.0–100.0)

## 2022-01-21 NOTE — Progress Notes (Signed)
Contacted via [place] afternoon [place], your labs have returned: - Kidney function, creatinine and eGFR, remains normal, as is liver function, AST and ALT.  - A1c remains in prediabetic range at 6%, slight trend up from previous -- continue focus on diet. - Remainder of labs stable -- no medication changes needed.  Any questions? Keep being amazing!!  Thank you for allowing me to participate in your care.  I appreciate you. Kindest regards, Aneliz Carbary

## 2022-01-22 ENCOUNTER — Encounter: Payer: Self-pay | Admitting: Nurse Practitioner

## 2022-02-03 DIAGNOSIS — G4733 Obstructive sleep apnea (adult) (pediatric): Secondary | ICD-10-CM | POA: Diagnosis not present

## 2022-02-27 DIAGNOSIS — G4733 Obstructive sleep apnea (adult) (pediatric): Secondary | ICD-10-CM | POA: Diagnosis not present

## 2022-03-25 DIAGNOSIS — L732 Hidradenitis suppurativa: Secondary | ICD-10-CM | POA: Diagnosis not present

## 2022-03-25 DIAGNOSIS — L304 Erythema intertrigo: Secondary | ICD-10-CM | POA: Diagnosis not present

## 2022-03-25 DIAGNOSIS — L570 Actinic keratosis: Secondary | ICD-10-CM | POA: Diagnosis not present

## 2022-03-25 DIAGNOSIS — L281 Prurigo nodularis: Secondary | ICD-10-CM | POA: Diagnosis not present

## 2022-03-25 DIAGNOSIS — L2089 Other atopic dermatitis: Secondary | ICD-10-CM | POA: Diagnosis not present

## 2022-03-25 DIAGNOSIS — D485 Neoplasm of uncertain behavior of skin: Secondary | ICD-10-CM | POA: Diagnosis not present

## 2022-03-25 DIAGNOSIS — L853 Xerosis cutis: Secondary | ICD-10-CM | POA: Diagnosis not present

## 2022-03-25 DIAGNOSIS — X32XXXA Exposure to sunlight, initial encounter: Secondary | ICD-10-CM | POA: Diagnosis not present

## 2022-04-15 DIAGNOSIS — G4733 Obstructive sleep apnea (adult) (pediatric): Secondary | ICD-10-CM | POA: Diagnosis not present

## 2022-05-14 DIAGNOSIS — G4733 Obstructive sleep apnea (adult) (pediatric): Secondary | ICD-10-CM | POA: Diagnosis not present

## 2022-06-14 DIAGNOSIS — G4733 Obstructive sleep apnea (adult) (pediatric): Secondary | ICD-10-CM | POA: Diagnosis not present

## 2022-07-16 DIAGNOSIS — H04123 Dry eye syndrome of bilateral lacrimal glands: Secondary | ICD-10-CM | POA: Diagnosis not present

## 2022-07-19 NOTE — Patient Instructions (Signed)
Be Involved in Your Health Care:  Taking Medications When medications are taken as directed, they can greatly improve your health. But if they are not taken as instructed, they may not work. In some cases, not taking them correctly can be harmful. To help ensure your treatment remains effective and safe, understand your medications and how to take them.  Your lab results, notes and after visit summary will be available on My Chart. We strongly encourage you to use this feature. If lab results are abnormal the clinic will contact you with the appropriate steps. If the clinic does not contact you assume the results are satisfactory. You can always see your results on My Chart. If you have questions regarding your condition, please contact the clinic during office hours. You can also ask questions on My Chart.  We at [facility] are grateful that you chose us to provide care. We strive to provide excellent and compassionate care and are always looking for feedback. If you get a survey from the clinic please complete this.   DASH Eating Plan DASH stands for Dietary Approaches to Stop Hypertension. The DASH eating plan is a healthy eating plan that has been shown to: Reduce high blood pressure (hypertension). Reduce your risk for type 2 diabetes, heart disease, and stroke. Help with weight loss. What are tips for following this plan? Reading food labels Check food labels for the amount of salt (sodium) per serving. Choose foods with less than 5 percent of the Daily Value of sodium. Generally, foods with less than 300 milligrams (mg) of sodium per serving fit into this eating plan. To find whole grains, look for the word "whole" as the first word in the ingredient list. Shopping Buy products labeled as "low-sodium" or "no salt added." Buy fresh foods. Avoid canned foods and pre-made or frozen meals. Cooking Avoid adding salt when cooking. Use salt-free seasonings or herbs instead of  table salt or sea salt. Check with your health care provider or pharmacist before using salt substitutes. Do not fry foods. Cook foods using healthy methods such as baking, boiling, grilling, roasting, and broiling instead. Cook with heart-healthy oils, such as olive, canola, avocado, soybean, or sunflower oil. Meal planning  Eat a balanced diet that includes: 4 or more servings of fruits and 4 or more servings of vegetables each day. Try to fill one-half of your plate with fruits and vegetables. 6-8 servings of whole grains each day. Less than 6 oz (170 g) of lean meat, poultry, or fish each day. A 3-oz (85-g) serving of meat is about the same size as a deck of cards. One egg equals 1 oz (28 g). 2-3 servings of low-fat dairy each day. One serving is 1 cup (237 mL). 1 serving of nuts, seeds, or beans 5 times each week. 2-3 servings of heart-healthy fats. Healthy fats called omega-3 fatty acids are found in foods such as walnuts, flaxseeds, fortified milks, and eggs. These fats are also found in cold-water fish, such as sardines, salmon, and mackerel. Limit how much you eat of: Canned or prepackaged foods. Food that is high in trans fat, such as some fried foods. Food that is high in saturated fat, such as fatty meat. Desserts and other sweets, sugary drinks, and other foods with added sugar. Full-fat dairy products. Do not salt foods before eating. Do not eat more than 4 egg yolks a week. Try to eat at least 2 vegetarian meals a week. Eat more home-cooked food and less restaurant,   buffet, and fast food. Lifestyle When eating at a restaurant, ask that your food be prepared with less salt or no salt, if possible. If you drink alcohol: Limit how much you use to: 0-1 drink a day for women who are not pregnant. 0-2 drinks a day for men. Be aware of how much alcohol is in your drink. In the [place] one drink equals one 12 oz bottle of beer (355 mL), one 5 oz glass of wine (148 mL), or one 1 oz  glass of hard liquor (44 mL). General information Avoid eating more than 2,300 mg of salt a day. If you have hypertension, you may need to reduce your sodium intake to 1,500 mg a day. Work with your health care provider to maintain a healthy body weight or to lose weight. Ask what an ideal weight is for you. Get at least 30 minutes of exercise that causes your heart to beat faster (aerobic exercise) most days of the week. Activities may include walking, swimming, or biking. Work with your health care provider or dietitian to adjust your eating plan to your individual calorie needs. What foods should I eat? Fruits All fresh, dried, or frozen fruit. Canned fruit in natural juice (without added sugar). Vegetables Fresh or frozen vegetables (raw, steamed, roasted, or grilled). Low-sodium or reduced-sodium tomato and vegetable juice. Low-sodium or reduced-sodium tomato sauce and tomato paste. Low-sodium or reduced-sodium canned vegetables. Grains Whole-grain or whole-wheat bread. Whole-grain or whole-wheat pasta. Brown rice. Oatmeal. Quinoa. Bulgur. Whole-grain and low-sodium cereals. Pita bread. Low-fat, low-sodium crackers. Whole-wheat flour tortillas. Meats and other proteins Skinless chicken or turkey. Ground chicken or turkey. Pork with fat trimmed off. Fish and seafood. Egg whites. Dried beans, peas, or lentils. Unsalted nuts, nut butters, and seeds. Unsalted canned beans. Lean cuts of beef with fat trimmed off. Low-sodium, lean precooked or cured meat, such as sausages or meat loaves. Dairy Low-fat (1%) or fat-free (skim) milk. Reduced-fat, low-fat, or fat-free cheeses. Nonfat, low-sodium ricotta or cottage cheese. Low-fat or nonfat yogurt. Low-fat, low-sodium cheese. Fats and oils Soft margarine without trans fats. Vegetable oil. Reduced-fat, low-fat, or light mayonnaise and salad dressings (reduced-sodium). Canola, safflower, olive, avocado, soybean, and sunflower oils. Avocado. Seasonings and  condiments Herbs. Spices. Seasoning mixes without salt. Other foods Unsalted popcorn and pretzels. Fat-free sweets. The items listed above may not be a complete list of foods and beverages you can eat. Contact a dietitian for more information. What foods should I avoid? Fruits Canned fruit in a light or heavy syrup. Fried fruit. Fruit in cream or butter sauce. Vegetables Creamed or fried vegetables. Vegetables in a cheese sauce. Regular canned vegetables (not low-sodium or reduced-sodium). Regular canned tomato sauce and paste (not low-sodium or reduced-sodium). Regular tomato and vegetable juice (not low-sodium or reduced-sodium). Pickles. Olives. Grains Baked goods made with fat, such as croissants, muffins, or some breads. Dry pasta or rice meal packs. Meats and other proteins Fatty cuts of meat. Ribs. Fried meat. Bacon. Bologna, salami, and other precooked or cured meats, such as sausages or meat loaves. Fat from the back of a pig (fatback). Bratwurst. Salted nuts and seeds. Canned beans with added salt. Canned or smoked fish. Whole eggs or egg yolks. Chicken or turkey with skin. Dairy Whole or 2% milk, cream, and half-and-half. Whole or full-fat cream cheese. Whole-fat or sweetened yogurt. Full-fat cheese. Nondairy creamers. Whipped toppings. Processed cheese and cheese spreads. Fats and oils Butter. Stick margarine. Lard. Shortening. Ghee. Bacon fat. Tropical oils, such as coconut, palm   kernel, or palm oil. Seasonings and condiments Onion salt, garlic salt, seasoned salt, table salt, and sea salt. Worcestershire sauce. Tartar sauce. Barbecue sauce. Teriyaki sauce. Soy sauce, including reduced-sodium. Steak sauce. Canned and packaged gravies. Fish sauce. Oyster sauce. Cocktail sauce. Store-bought horseradish. Ketchup. Mustard. Meat flavorings and tenderizers. Bouillon cubes. Hot sauces. Pre-made or packaged marinades. Pre-made or packaged taco seasonings. Relishes. Regular salad  dressings. Other foods Salted popcorn and pretzels. The items listed above may not be a complete list of foods and beverages you should avoid. Contact a dietitian for more information. Where to find more information National Heart, Lung, and Blood Institute: www.nhlbi.nih.gov American Heart Association: www.heart.org Academy of Nutrition and Dietetics: www.eatright.org National Kidney Foundation: www.kidney.org Summary The DASH eating plan is a healthy eating plan that has been shown to reduce high blood pressure (hypertension). It may also reduce your risk for type 2 diabetes, heart disease, and stroke. When on the DASH eating plan, aim to eat more fresh fruits and vegetables, whole grains, lean proteins, low-fat dairy, and heart-healthy fats. With the DASH eating plan, you should limit salt (sodium) intake to 2,300 mg a day. If you have hypertension, you may need to reduce your sodium intake to 1,500 mg a day. Work with your health care provider or dietitian to adjust your eating plan to your individual calorie needs. This information is not intended to replace advice given to you by your health care provider. Make sure you discuss any questions you have with your health care provider. Document Revised: 01/21/2019 Document Reviewed: 01/21/2019 Elsevier Patient Education  2023 Elsevier Inc.  

## 2022-07-21 ENCOUNTER — Encounter: Payer: Self-pay | Admitting: Nurse Practitioner

## 2022-07-21 ENCOUNTER — Ambulatory Visit: Payer: BC Managed Care – PPO | Admitting: Nurse Practitioner

## 2022-07-21 VITALS — BP 128/81 | HR 91 | Temp 98.3°F | Ht 65.0 in | Wt 197.4 lb

## 2022-07-21 DIAGNOSIS — I10 Essential (primary) hypertension: Secondary | ICD-10-CM

## 2022-07-21 DIAGNOSIS — J019 Acute sinusitis, unspecified: Secondary | ICD-10-CM | POA: Insufficient documentation

## 2022-07-21 DIAGNOSIS — E7800 Pure hypercholesterolemia, unspecified: Secondary | ICD-10-CM

## 2022-07-21 DIAGNOSIS — J0110 Acute frontal sinusitis, unspecified: Secondary | ICD-10-CM

## 2022-07-21 DIAGNOSIS — K219 Gastro-esophageal reflux disease without esophagitis: Secondary | ICD-10-CM | POA: Diagnosis not present

## 2022-07-21 DIAGNOSIS — Z6831 Body mass index (BMI) 31.0-31.9, adult: Secondary | ICD-10-CM

## 2022-07-21 DIAGNOSIS — G4733 Obstructive sleep apnea (adult) (pediatric): Secondary | ICD-10-CM

## 2022-07-21 DIAGNOSIS — R7303 Prediabetes: Secondary | ICD-10-CM

## 2022-07-21 DIAGNOSIS — J301 Allergic rhinitis due to pollen: Secondary | ICD-10-CM

## 2022-07-21 DIAGNOSIS — E348 Other specified endocrine disorders: Secondary | ICD-10-CM

## 2022-07-21 DIAGNOSIS — E6609 Other obesity due to excess calories: Secondary | ICD-10-CM

## 2022-07-21 LAB — MICROALBUMIN, URINE WAIVED
Creatinine, Urine Waived: 10 mg/dL (ref 10–300)
Microalb, Ur Waived: 10 mg/L (ref 0–19)

## 2022-07-21 MED ORDER — PREDNISONE 20 MG PO TABS: 40.0000 mg | ORAL_TABLET | Freq: Every day | ORAL | 0 refills | Status: AC

## 2022-07-21 MED ORDER — HYDROCOD POLI-CHLORPHE POLI ER 10-8 MG/5ML PO SUER: 5.0000 mL | Freq: Every evening | ORAL | 0 refills | Status: DC | PRN

## 2022-07-21 MED ORDER — ALBUTEROL SULFATE HFA 108 (90 BASE) MCG/ACT IN AERS: 2.0000 | INHALATION_SPRAY | Freq: Four times a day (QID) | RESPIRATORY_TRACT | 0 refills | Status: DC | PRN

## 2022-07-21 MED ORDER — AZITHROMYCIN 250 MG PO TABS: ORAL_TABLET | ORAL | 0 refills | Status: AC

## 2022-07-21 NOTE — Assessment & Plan Note (Signed)
Chronic, stable.  Continue current medication regimen and adjust as needed.  Mag level next visit.  Risks of PPI use were discussed with patient including bone loss, C. Diff diarrhea, pneumonia, infections, CKD, electrolyte abnormalities.  Verbalizes understanding and chooses to continue the medication.  

## 2022-07-21 NOTE — Assessment & Plan Note (Signed)
Followed by neurology, continue 100% use of CPAP. 

## 2022-07-21 NOTE — Assessment & Plan Note (Signed)
Chronic, stable.  BP at goal in office today.  Recommend she monitor BP at least a few mornings a week at home and document.  DASH diet at home.  Continue current medication regimen and adjust as needed.  Labs today: CMP and urine ALB.  Return in 6 months.

## 2022-07-21 NOTE — Assessment & Plan Note (Signed)
Chronic, ongoing.  Followed by ENT, continue this collaboration.

## 2022-07-21 NOTE — Assessment & Plan Note (Addendum)
Acute for over 2 weeks.  At this time will start Prednisone 40 MG daily for 5 days, Zpack, and Albuterol inhaler as needed + tussionex for cough.  Continue current ENT regimen.  Covid negative at home on multiple tests.  Recommend: - Increased rest - Increasing Fluids - Acetaminophen as needed for fever/pain.  - Salt water gargling, chloraseptic spray and throat lozenges - Mucinex.  - Humidifying the air.

## 2022-07-21 NOTE — Assessment & Plan Note (Signed)
Ongoing and stable.  Praised for success with diet and exercise.  Continue this focus and recheck A1c today, if stable check Q6MOS to annually.

## 2022-07-21 NOTE — Assessment & Plan Note (Signed)
Chronic, ongoing.  Continue current medication regimen.  Lipid panel today and adjust statin as needed. 

## 2022-07-21 NOTE — Assessment & Plan Note (Signed)
BMI 32.85.  Recommended eating smaller high protein, low fat meals more frequently and exercising 30 mins a day 5 times a week with a goal of 10-15lb weight loss in the next 3 months. Patient voiced their understanding and motivation to adhere to these recommendations.  

## 2022-07-21 NOTE — Progress Notes (Signed)
BP 128/81   Pulse 91   Temp 98.3 F (36.8 C) (Oral)   Ht 5\' 5"  (1.651 m)   Wt 197 lb 6.4 oz (89.5 kg)   LMP  (LMP Unknown)   SpO2 97%   BMI 32.85 kg/m    Subjective:    Patient ID: Carrie Arroyo, female    DOB: Feb 20, 1961, 62 y.o.   MRN: [id number]  HPI: Carrie Arroyo is a 62 y.o. female  Chief Complaint  Patient presents with   Prediabetes   Hypertension   Hyperlipidemia   Sleep Apnea   sinus pressure    Sinus drainage, tooth pain, started 3 weeks, has taken many covid tests in past 3 weeks all negative.    HYPERTENSION / HYPERLIPIDEMIA Continues on Benazepril, Simvastatin, ASA.  Former smoker, just occasional alcohol use.  Has CPAP she uses, every night 100% -- 02/27/22 neurology last. Satisfied with current treatment? yes Duration of hypertension: chronic BP monitoring frequency: not lately BP range:  BP medication side effects: no Duration of hyperlipidemia: chronic Cholesterol medication side effects: no Cholesterol supplements: none Medication compliance: good compliance Aspirin: yes Recent stressors: no Recurrent headaches: no Visual changes: no Palpitations: no Dyspnea: no Chest pain: no Lower extremity edema: end of day on occasion Dizzy/lightheaded: no  The 10-year ASCVD risk score (Arnett DK, et al., 2019) is: 4.4%   Values used to calculate the score:     Age: 37 years     Sex: Female     Is Non-Hispanic African American: No     Diabetic: No     Tobacco smoker: No     Systolic Blood Pressure: 128 mmHg     Is BP treated: Yes     HDL Cholesterol: 49 mg/dL     Total Cholesterol: 158 mg/dL   PREDIABETES: Last W0J 6% November 2023. Hypoglycemic episodes:no Polydipsia/polyuria: no Visual disturbance: no Chest pain: no Paresthesias: no   GERD Continues on Prilosec, generic -- OTC.   GERD control status: stable  Satisfied with current treatment? yes Heartburn frequency: none Medication side effects: no  Medication compliance:  stable Previous GERD medications: TUMS Antacid use frequency:  none  Dysphagia: no Odynophagia:  no Hematemesis: no Blood in stool: no EGD: yes  UPPER RESPIRATORY TRACT INFECTION Sees ENT yearly, last in December 2023.  Current symptoms started mid-April with worsening allergy symptoms -- Claritin and Azelastine. Fever: no Cough: yes Shortness of breath: yes Wheezing: yes Chest pain: no Chest tightness: yes Chest congestion: yes Nasal congestion: yes Runny nose: yes Post nasal drip: yes Sneezing: yes Sore throat: no Swollen glands: no Sinus pressure: yes Headache: yes Face pain: yes Toothache: no Ear pain: none Ear pressure: yes bilateral Eyes red/itching:no Eye drainage/crusting: no  Vomiting: no Rash: no Fatigue: yes Sick contacts: no Strep contacts: no  Context: fluctuating Recurrent sinusitis: no Relief with OTC cold/cough medications: no  Treatments attempted: Claritin, Alka Seltzer Cold and Flu x 2 boxes    Relevant past medical, surgical, family and social history reviewed and updated as indicated. Interim medical history since our last visit reviewed. Allergies and medications reviewed and updated.  Review of Systems  Constitutional:  Positive for fatigue. Negative for activity change, appetite change, diaphoresis and fever.  HENT:  Positive for congestion, ear pain, postnasal drip, rhinorrhea, sinus pressure, sinus pain and sneezing. Negative for voice change.   Respiratory:  Positive for cough, chest tightness, shortness of breath and wheezing.   Cardiovascular:  Negative for chest pain, palpitations  and leg swelling.  Gastrointestinal: Negative.   Endocrine: Negative for cold intolerance, heat intolerance, polydipsia, polyphagia and polyuria.  Neurological:  Positive for headaches. Negative for dizziness, weakness and numbness.  Psychiatric/Behavioral: Negative.      Per HPI unless specifically indicated above     Objective:    BP 128/81    Pulse 91   Temp 98.3 F (36.8 C) (Oral)   Ht 5\' 5"  (1.651 m)   Wt 197 lb 6.4 oz (89.5 kg)   LMP  (LMP Unknown)   SpO2 97%   BMI 32.85 kg/m   Wt Readings from Last 3 Encounters:  07/21/22 197 lb 6.4 oz (89.5 kg)  01/20/22 188 lb 6.4 oz (85.5 kg)  07/16/21 187 lb 3.2 oz (84.9 kg)    Physical Exam Vitals and nursing note reviewed.  Constitutional:      General: She is awake. She is not in acute distress.    Appearance: She is well-developed and overweight. She is not ill-appearing.  HENT:     Head: Normocephalic.     Right Ear: Hearing, ear canal and external ear normal. A middle ear effusion is present. There is no impacted cerumen. Tympanic membrane is not injected.     Left Ear: Hearing, ear canal and external ear normal. A middle ear effusion is present. There is no impacted cerumen. Tympanic membrane is not injected.     Nose: Rhinorrhea present. Rhinorrhea is clear.     Right Sinus: Frontal sinus tenderness present. No maxillary sinus tenderness.     Left Sinus: Frontal sinus tenderness present. No maxillary sinus tenderness.     Mouth/Throat:     Mouth: Mucous membranes are moist.     Pharynx: Posterior oropharyngeal erythema present. No pharyngeal swelling or oropharyngeal exudate.  Eyes:     General: Lids are normal.        Right eye: No discharge.        Left eye: No discharge.     Conjunctiva/sclera: Conjunctivae normal.     Pupils: Pupils are equal, round, and reactive to light.  Neck:     Thyroid: No thyromegaly.     Vascular: No carotid bruit.  Cardiovascular:     Rate and Rhythm: Normal rate and regular rhythm.     Heart sounds: Normal heart sounds. No murmur heard.    No gallop.  Pulmonary:     Effort: Pulmonary effort is normal.     Breath sounds: Normal breath sounds.  Abdominal:     General: Bowel sounds are normal. There is no distension.     Palpations: Abdomen is soft.     Tenderness: There is no abdominal tenderness.  Musculoskeletal:     Cervical  back: Normal range of motion and neck supple.     Right lower leg: No edema.     Left lower leg: No edema.  Skin:    General: Skin is warm and dry.  Neurological:     Mental Status: She is alert and oriented to person, place, and time.  Psychiatric:        Attention and Perception: Attention normal.        Mood and Affect: Mood normal.        Behavior: Behavior normal. Behavior is cooperative.        Thought Content: Thought content normal.        Judgment: Judgment normal.    Results for orders placed or performed in visit on 01/20/22  T4, free  Result Value  Ref Range   Free T4 1.05 0.82 - 1.77 ng/dL  TSH  Result Value Ref Range   TSH 1.400 0.450 - 4.500 uIU/mL  Comprehensive metabolic panel  Result Value Ref Range   Glucose 103 (H) 70 - 99 mg/dL   BUN 10 8 - 27 mg/dL   Creatinine, Ser 4.09 0.57 - 1.00 mg/dL   eGFR 94 >81 XB/JYN/8.29   BUN/Creatinine Ratio 14 12 - 28   Sodium 138 134 - 144 mmol/L   Potassium 4.1 3.5 - 5.2 mmol/L   Chloride 99 96 - 106 mmol/L   CO2 23 20 - 29 mmol/L   Calcium 10.1 8.7 - 10.3 mg/dL   Total Protein 7.2 6.0 - 8.5 g/dL   Albumin 4.8 3.9 - 4.9 g/dL   Globulin, Total 2.4 1.5 - 4.5 g/dL   Albumin/Globulin Ratio 2.0 1.2 - 2.2   Bilirubin Total 0.3 0.0 - 1.2 mg/dL   Alkaline Phosphatase 104 44 - 121 IU/L   AST 26 0 - 40 IU/L   ALT 23 0 - 32 IU/L  CBC with Differential/Platelet  Result Value Ref Range   WBC 9.3 3.4 - 10.8 x10E3/uL   RBC 4.49 3.77 - 5.28 x10E6/uL   Hemoglobin 14.0 11.1 - 15.9 g/dL   Hematocrit 56.2 13.0 - 46.6 %   MCV 95 79 - 97 fL   MCH 31.2 26.6 - 33.0 pg   MCHC 32.9 31.5 - 35.7 g/dL   RDW 86.5 78.4 - 69.6 %   Platelets 375 150 - 450 x10E3/uL   Neutrophils 56 Not Estab. %   Lymphs 32 Not Estab. %   Monocytes 9 Not Estab. %   Eos 2 Not Estab. %   Basos 1 Not Estab. %   Neutrophils Absolute 5.3 1.4 - 7.0 x10E3/uL   Lymphocytes Absolute 3.0 0.7 - 3.1 x10E3/uL   Monocytes Absolute 0.8 0.1 - 0.9 x10E3/uL   EOS  (ABSOLUTE) 0.2 0.0 - 0.4 x10E3/uL   Basophils Absolute 0.1 0.0 - 0.2 x10E3/uL   Immature Granulocytes 0 Not Estab. %   Immature Grans (Abs) 0.0 0.0 - 0.1 x10E3/uL  Lipid Panel w/o Chol/HDL Ratio  Result Value Ref Range   Cholesterol, Total 158 100 - 199 mg/dL   Triglycerides 295 0 - 149 mg/dL   HDL 49 >28 mg/dL   VLDL Cholesterol Cal 22 5 - 40 mg/dL   LDL Chol Calc (NIH) 87 0 - 99 mg/dL  VITAMIN D 25 Hydroxy (Vit-D Deficiency, Fractures)  Result Value Ref Range   Vit D, 25-Hydroxy 35.9 30.0 - 100.0 ng/mL  HgB A1c  Result Value Ref Range   Hgb A1c MFr Bld 6.0 (H) 4.8 - 5.6 %   Est. average glucose Bld gHb Est-mCnc 126 mg/dL  Magnesium  Result Value Ref Range   Magnesium 2.1 1.6 - 2.3 mg/dL      Assessment & Plan:   Problem List Items Addressed This Visit       Cardiovascular and Mediastinum   Hypertension - Primary    Chronic, stable.  BP at goal in office today.  Recommend she monitor BP at least a few mornings a week at home and document.  DASH diet at home.  Continue current medication regimen and adjust as needed.  Labs today: CMP and urine ALB.  Return in 6 months.         Relevant Orders   Microalbumin, Urine Waived   Comprehensive metabolic panel     Respiratory   Acute sinusitis  Acute for over 2 weeks.  At this time will start Prednisone 40 MG daily for 5 days, Zpack, and Albuterol inhaler as needed + tussionex for cough.  Continue current ENT regimen.  Covid negative at home on multiple tests.  Recommend: - Increased rest - Increasing Fluids - Acetaminophen as needed for fever/pain.  - Salt water gargling, chloraseptic spray and throat lozenges - Mucinex.  - Humidifying the air.       Relevant Medications   predniSONE (DELTASONE) 20 MG tablet   azithromycin (ZITHROMAX) 250 MG tablet   chlorpheniramine-HYDROcodone (TUSSIONEX) 10-8 MG/5ML   Allergic rhinitis    Chronic, ongoing.  Followed by ENT, continue this collaboration.      OSA on CPAP     Followed by neurology, continue 100% use of CPAP.        Digestive   GERD (gastroesophageal reflux disease)    Chronic, stable.  Continue current medication regimen and adjust as needed.  Mag level next visit.  Risks of PPI use were discussed with patient including bone loss, C. Diff diarrhea, pneumonia, infections, CKD, electrolyte abnormalities.  Verbalizes understanding and chooses to continue the medication.         Other   Hyperlipidemia    Chronic, ongoing.  Continue current medication regimen.  Lipid panel today and adjust statin as needed.      Relevant Orders   Comprehensive metabolic panel   Lipid Panel w/o Chol/HDL Ratio   Obesity    BMI 32.85.  Recommended eating smaller high protein, low fat meals more frequently and exercising 30 mins a day 5 times a week with a goal of 10-15lb weight loss in the next 3 months. Patient voiced their understanding and motivation to adhere to these recommendations.       Prediabetes    Ongoing and stable.  Praised for success with diet and exercise.  Continue this focus and recheck A1c today, if stable check Q6MOS to annually.      Relevant Orders   HgB A1c   Microalbumin, Urine Waived     Follow up plan: Return in about 6 months (around 01/21/2023) for Annual physical due after 01/21/23.

## 2022-07-22 LAB — COMPREHENSIVE METABOLIC PANEL
ALT: 41 IU/L — ABNORMAL HIGH (ref 0–32)
AST: 35 IU/L (ref 0–40)
Albumin/Globulin Ratio: 1.9 (ref 1.2–2.2)
Albumin: 4.6 g/dL (ref 3.9–4.9)
Alkaline Phosphatase: 129 IU/L — ABNORMAL HIGH (ref 44–121)
BUN/Creatinine Ratio: 9 — ABNORMAL LOW (ref 12–28)
BUN: 6 mg/dL — ABNORMAL LOW (ref 8–27)
Bilirubin Total: 0.3 mg/dL (ref 0.0–1.2)
CO2: 23 mmol/L (ref 20–29)
Calcium: 9.5 mg/dL (ref 8.7–10.3)
Chloride: 97 mmol/L (ref 96–106)
Creatinine, Ser: 0.65 mg/dL (ref 0.57–1.00)
Globulin, Total: 2.4 g/dL (ref 1.5–4.5)
Glucose: 99 mg/dL (ref 70–99)
Potassium: 3.9 mmol/L (ref 3.5–5.2)
Sodium: 135 mmol/L (ref 134–144)
Total Protein: 7 g/dL (ref 6.0–8.5)
eGFR: 100 mL/min/{1.73_m2} (ref >59)

## 2022-07-22 LAB — HEMOGLOBIN A1C
Est. average glucose Bld gHb Est-mCnc: 128 mg/dL
Hgb A1c MFr Bld: 6.1 % — ABNORMAL HIGH (ref 4.8–5.6)

## 2022-07-22 LAB — LIPID PANEL W/O CHOL/HDL RATIO
Cholesterol, Total: 147 mg/dL (ref 100–199)
HDL: 44 mg/dL (ref >39)
LDL Chol Calc (NIH): 80 mg/dL (ref 0–99)
Triglycerides: 130 mg/dL (ref 0–149)
VLDL Cholesterol Cal: 23 mg/dL (ref 5–40)

## 2022-07-22 NOTE — Progress Notes (Signed)
Contacted via MyChart   Good afternoon Nika, your labs have returned: - A1c still in prediabetic range which we will monitor. - Kidney function, creatinine and eGFR, remains normal.  Liver function shows mild elevation in ALT and normal AST.  We will recheck next visit.  Recommend reduce any alcohol or Tylenol use if present + focus on healthy diet.  Any questions? Keep being amazing!!  Thank you for allowing me to participate in your care.  I appreciate you. Kindest regards, Hervey Wedig

## 2022-07-30 ENCOUNTER — Encounter: Payer: Self-pay | Admitting: Nurse Practitioner

## 2022-07-30 MED ORDER — AZITHROMYCIN 250 MG PO TABS: ORAL_TABLET | ORAL | 0 refills | Status: AC

## 2022-09-18 DIAGNOSIS — G4733 Obstructive sleep apnea (adult) (pediatric): Secondary | ICD-10-CM | POA: Diagnosis not present

## 2022-10-19 DIAGNOSIS — G4733 Obstructive sleep apnea (adult) (pediatric): Secondary | ICD-10-CM | POA: Diagnosis not present

## 2022-10-27 ENCOUNTER — Ambulatory Visit: Payer: BC Managed Care – PPO | Admitting: Nurse Practitioner

## 2022-10-27 ENCOUNTER — Encounter: Payer: Self-pay | Admitting: Nurse Practitioner

## 2022-10-28 ENCOUNTER — Ambulatory Visit: Payer: BC Managed Care – PPO | Admitting: Nurse Practitioner

## 2022-10-28 ENCOUNTER — Encounter: Payer: Self-pay | Admitting: Nurse Practitioner

## 2022-10-28 VITALS — BP 132/78 | HR 82 | Temp 98.2°F | Wt 197.4 lb

## 2022-10-28 DIAGNOSIS — R8281 Pyuria: Secondary | ICD-10-CM | POA: Diagnosis not present

## 2022-10-28 DIAGNOSIS — R399 Unspecified symptoms and signs involving the genitourinary system: Secondary | ICD-10-CM | POA: Insufficient documentation

## 2022-10-28 LAB — MICROSCOPIC EXAMINATION: Bacteria, UA: NONE SEEN

## 2022-10-28 LAB — URINALYSIS, ROUTINE W REFLEX MICROSCOPIC
Bilirubin, UA: NEGATIVE
Glucose, UA: NEGATIVE
Ketones, UA: NEGATIVE
Leukocytes,UA: NEGATIVE
Nitrite, UA: NEGATIVE
Protein,UA: NEGATIVE
Specific Gravity, UA: 1.01 (ref 1.005–1.030)
Urobilinogen, Ur: 0.2 mg/dL (ref 0.2–1.0)
pH, UA: 6.5 (ref 5.0–7.5)

## 2022-10-28 LAB — WET PREP FOR TRICH, YEAST, CLUE
Clue Cell Exam: NEGATIVE
Trichomonas Exam: NEGATIVE
Yeast Exam: NEGATIVE

## 2022-10-28 MED ORDER — NITROFURANTOIN MONOHYD MACRO 100 MG PO CAPS: 100.0000 mg | ORAL_CAPSULE | Freq: Two times a day (BID) | ORAL | 0 refills | Status: AC

## 2022-10-28 NOTE — Assessment & Plan Note (Signed)
Acute and present for 2 days.  UA noting trace blood.  Wet prep negative.  Due to symptoms will send for culture + send in Macrobid 100 MG BID for 5 days as she is traveling out of town.  Will adjust regimen as needed.  Recommend continue to increase hydration.

## 2022-10-28 NOTE — Progress Notes (Signed)
BP 132/78   Pulse 82   Temp 98.2 F (36.8 C)   Wt 197 lb 6.4 oz (89.5 kg)   LMP  (LMP Unknown)   SpO2 98%   BMI 32.85 kg/m    Subjective:    Patient ID: [name], female    DOB: Jul 17, 1960, 62 y.o.   MRN: [id number]  HPI: WANELL NEWVILLE is a 62 y.o. female  Chief Complaint  Patient presents with   Dysuria    Pressure; burning during urination; few days   URINARY SYMPTOMS Symptoms started 2 days ago.  Always gets UTI this time of year.  Takes cranberry supplement daily. Dysuria: yes Urinary frequency: yes Urgency: yes Small volume voids: yes Symptom severity: yes Urinary incontinence:  occasional dribbling Foul odor: yes Hematuria: no Abdominal pain: no Back pain: no Suprapubic pain/pressure: yes Flank pain: no Fever:  no Vomiting: no Status: stable Previous urinary tract infection: yes Recurrent urinary tract infection: no Sexual activity: No sexually active History of sexually transmitted disease: no Treatments attempted: increasing fluids    Relevant past medical, surgical, family and social history reviewed and updated as indicated. Interim medical history since our last visit reviewed. Allergies and medications reviewed and updated.  Review of Systems  Constitutional:  Negative for activity change, appetite change, diaphoresis, fatigue and fever.  Respiratory:  Negative for cough, chest tightness and shortness of breath.   Cardiovascular:  Negative for chest pain, palpitations and leg swelling.  Gastrointestinal:  Negative for abdominal distention, abdominal pain, constipation, diarrhea, nausea and vomiting.  Genitourinary:  Positive for decreased urine volume, dysuria, frequency and urgency. Negative for hematuria.  Neurological: Negative.   Psychiatric/Behavioral: Negative.      Per HPI unless specifically indicated above     Objective:    BP 132/78   Pulse 82   Temp 98.2 F (36.8 C)   Wt 197 lb 6.4 oz (89.5 kg)   LMP  (LMP Unknown)    SpO2 98%   BMI 32.85 kg/m   Wt Readings from Last 3 Encounters:  10/28/22 197 lb 6.4 oz (89.5 kg)  07/21/22 197 lb 6.4 oz (89.5 kg)  01/20/22 188 lb 6.4 oz (85.5 kg)    Physical Exam Vitals and nursing note reviewed.  Constitutional:      General: She is awake. She is not in acute distress.    Appearance: She is well-developed and well-groomed. She is obese. She is not ill-appearing or toxic-appearing.  HENT:     Head: Normocephalic.     Right Ear: Hearing and external ear normal.     Left Ear: Hearing and external ear normal.  Eyes:     General: Lids are normal.        Right eye: No discharge.        Left eye: No discharge.     Conjunctiva/sclera: Conjunctivae normal.     Pupils: Pupils are equal, round, and reactive to light.  Neck:     Thyroid: No thyromegaly.     Vascular: No carotid bruit.  Cardiovascular:     Rate and Rhythm: Normal rate and regular rhythm.     Heart sounds: Normal heart sounds. No murmur heard.    No gallop.  Pulmonary:     Effort: Pulmonary effort is normal. No accessory muscle usage or respiratory distress.     Breath sounds: Normal breath sounds.  Abdominal:     General: Bowel sounds are normal. There is no distension.     Palpations: Abdomen  is soft.     Tenderness: There is abdominal tenderness in the suprapubic area. There is no right CVA tenderness or left CVA tenderness.  Musculoskeletal:     Cervical back: Normal range of motion and neck supple.     Right lower leg: No edema.     Left lower leg: No edema.  Lymphadenopathy:     Cervical: No cervical adenopathy.  Skin:    General: Skin is warm and dry.  Neurological:     Mental Status: She is alert and oriented to person, place, and time.     Deep Tendon Reflexes: Reflexes are normal and symmetric.     Reflex Scores:      Brachioradialis reflexes are 2+ on the right side and 2+ on the left side.      Patellar reflexes are 2+ on the right side and 2+ on the left side. Psychiatric:         Attention and Perception: Attention normal.        Mood and Affect: Mood normal.        Speech: Speech normal.        Behavior: Behavior normal. Behavior is cooperative.        Thought Content: Thought content normal.     Results for orders placed or performed in visit on 07/21/22  HgB A1c  Result Value Ref Range   Hgb A1c MFr Bld 6.1 (H) 4.8 - 5.6 %   Est. average glucose Bld gHb Est-mCnc 128 mg/dL  Microalbumin, Urine Waived  Result Value Ref Range   Microalb, Ur Waived 10 0 - 19 mg/L   Creatinine, Urine Waived 10 10 - 300 mg/dL   Microalb/Creat Ratio 30-300 (H) <30 mg/g  Comprehensive metabolic panel  Result Value Ref Range   Glucose 99 70 - 99 mg/dL   BUN 6 (L) 8 - 27 mg/dL   Creatinine, Ser 0.93 0.57 - 1.00 mg/dL   eGFR 235 >57 DU/KGU/5.42   BUN/Creatinine Ratio 9 (L) 12 - 28   Sodium 135 134 - 144 mmol/L   Potassium 3.9 3.5 - 5.2 mmol/L   Chloride 97 96 - 106 mmol/L   CO2 23 20 - 29 mmol/L   Calcium 9.5 8.7 - 10.3 mg/dL   Total Protein 7.0 6.0 - 8.5 g/dL   Albumin 4.6 3.9 - 4.9 g/dL   Globulin, Total 2.4 1.5 - 4.5 g/dL   Albumin/Globulin Ratio 1.9 1.2 - 2.2   Bilirubin Total 0.3 0.0 - 1.2 mg/dL   Alkaline Phosphatase 129 (H) 44 - 121 IU/L   AST 35 0 - 40 IU/L   ALT 41 (H) 0 - 32 IU/L  Lipid Panel w/o Chol/HDL Ratio  Result Value Ref Range   Cholesterol, Total 147 100 - 199 mg/dL   Triglycerides 706 0 - 149 mg/dL   HDL 44 >23 mg/dL   VLDL Cholesterol Cal 23 5 - 40 mg/dL   LDL Chol Calc (NIH) 80 0 - 99 mg/dL      Assessment & Plan:   Problem List Items Addressed This Visit       Other   Urinary symptom or sign - Primary    Acute and present for 2 days.  UA noting trace blood.  Wet prep negative.  Due to symptoms will send for culture + send in Macrobid 100 MG BID for 5 days as she is traveling out of town.  Will adjust regimen as needed.  Recommend continue to increase hydration.  Relevant Orders   Urinalysis, Routine w reflex microscopic   WET  PREP FOR TRICH, YEAST, CLUE   Urine Culture   Other Visit Diagnoses     Pyuria       Urine sent for culture.   Relevant Orders   Urine Culture        Follow up plan: Return if symptoms worsen or fail to improve.

## 2022-10-28 NOTE — Patient Instructions (Signed)
Urinary Tract Infection, Adult A urinary tract infection (UTI) is an infection of any part of the urinary tract. The urinary tract includes: The kidneys. The ureters. The bladder. The urethra. These organs make, store, and get rid of pee (urine) in the body. What are the causes? This infection is caused by germs (bacteria) in your genital area. These germs grow and cause swelling (inflammation) of your urinary tract. What increases the risk? The following factors may make you more likely to develop this condition: Using a small, thin tube (catheter) to drain pee. Not being able to control when you pee or poop (incontinence). Being female. If you are female, these things can increase the risk: Using these methods to prevent pregnancy: A medicine that kills sperm (spermicide). A device that blocks sperm (diaphragm). Having low levels of a female hormone (estrogen). Being pregnant. You are more likely to develop this condition if: You have genes that add to your risk. You are sexually active. You take antibiotic medicines. You have trouble peeing because of: A prostate that is bigger than normal, if you are female. A blockage in the part of your body that drains pee from the bladder. A kidney stone. A nerve condition that affects your bladder. Not getting enough to drink. Not peeing often enough. You have other conditions, such as: Diabetes. A weak disease-fighting system (immune system). Sickle cell disease. Gout. Injury of the spine. What are the signs or symptoms? Symptoms of this condition include: Needing to pee right away. Peeing small amounts often. Pain or burning when peeing. Blood in the pee. Pee that smells bad or not like normal. Trouble peeing. Pee that is cloudy. Fluid coming from the vagina, if you are female. Pain in the belly or lower back. Other symptoms include: Vomiting. Not feeling hungry. Feeling mixed up (confused). This may be the first symptom in  older adults. Being tired and grouchy (irritable). A fever. Watery poop (diarrhea). How is this treated? Taking antibiotic medicine. Taking other medicines. Drinking enough water. In some cases, you may need to see a specialist. Follow these instructions at home:  Medicines Take over-the-counter and prescription medicines only as told by your doctor. If you were prescribed an antibiotic medicine, take it as told by your doctor. Do not stop taking it even if you start to feel better. General instructions Make sure you: Pee until your bladder is empty. Do not hold pee for a long time. Empty your bladder after sex. Wipe from front to back after peeing or pooping if you are a female. Use each tissue one time when you wipe. Drink enough fluid to keep your pee pale yellow. Keep all follow-up visits. Contact a doctor if: You do not get better after 1-2 days. Your symptoms go away and then come back. Get help right away if: You have very bad back pain. You have very bad pain in your lower belly. You have a fever. You have chills. You feeling like you will vomit or you vomit. Summary A urinary tract infection (UTI) is an infection of any part of the urinary tract. This condition is caused by germs in your genital area. There are many risk factors for a UTI. Treatment includes antibiotic medicines. Drink enough fluid to keep your pee pale yellow. This information is not intended to replace advice given to you by your health care provider. Make sure you discuss any questions you have with your health care provider. Document Revised: 09/25/2019 Document Reviewed: 09/30/2019 Elsevier Patient Education    2024 [employer]vier Inc.  

## 2022-11-19 DIAGNOSIS — G4733 Obstructive sleep apnea (adult) (pediatric): Secondary | ICD-10-CM | POA: Diagnosis not present

## 2022-11-28 DIAGNOSIS — R92323 Mammographic fibroglandular density, bilateral breasts: Secondary | ICD-10-CM | POA: Diagnosis not present

## 2022-11-28 DIAGNOSIS — Z1231 Encounter for screening mammogram for malignant neoplasm of breast: Secondary | ICD-10-CM | POA: Diagnosis not present

## 2022-11-28 LAB — HM MAMMOGRAPHY

## 2022-12-30 ENCOUNTER — Ambulatory Visit
Admission: RE | Admit: 2022-12-30 | Discharge: 2022-12-30 | Disposition: A | Discharge status: Home / Self Care | Payer: BC Managed Care – PPO | Source: Ambulatory Visit | Attending: Family Medicine

## 2022-12-30 ENCOUNTER — Ambulatory Visit: Payer: BC Managed Care – PPO | Admitting: Family Medicine

## 2022-12-30 ENCOUNTER — Ambulatory Visit
Admission: RE | Admit: 2022-12-30 | Discharge: 2022-12-30 | Disposition: A | Discharge status: Home / Self Care | Payer: BC Managed Care – PPO | Attending: Family Medicine | Admitting: Family Medicine

## 2022-12-30 VITALS — BP 122/74 | HR 105 | Temp 98.1°F | Ht 64.57 in | Wt 193.0 lb

## 2022-12-30 DIAGNOSIS — R062 Wheezing: Secondary | ICD-10-CM | POA: Diagnosis not present

## 2022-12-30 DIAGNOSIS — J069 Acute upper respiratory infection, unspecified: Secondary | ICD-10-CM | POA: Diagnosis not present

## 2022-12-30 DIAGNOSIS — R0602 Shortness of breath: Secondary | ICD-10-CM | POA: Diagnosis not present

## 2022-12-30 LAB — VERITOR FLU A/B WAIVED
Influenza A: NEGATIVE
Influenza B: NEGATIVE

## 2022-12-30 MED ORDER — ALBUTEROL SULFATE (2.5 MG/3ML) 0.083% IN NEBU
2.5000 mg | INHALATION_SOLUTION | Freq: Once | RESPIRATORY_TRACT | Status: AC
Start: 2022-12-30 — End: 2022-12-30
  Administered 2022-12-30: 2.5 mg via RESPIRATORY_TRACT

## 2022-12-30 MED ORDER — PREDNISONE 10 MG PO TABS: 40.0000 mg | ORAL_TABLET | Freq: Every day | ORAL | 0 refills | Status: AC

## 2022-12-30 MED ORDER — HYDROCOD POLI-CHLORPHE POLI ER 10-8 MG/5ML PO SUER: 5.0000 mL | Freq: Every evening | ORAL | 0 refills | Status: DC | PRN

## 2022-12-30 MED ORDER — ALBUTEROL SULFATE HFA 108 (90 BASE) MCG/ACT IN AERS
2.0000 | INHALATION_SPRAY | Freq: Four times a day (QID) | RESPIRATORY_TRACT | 0 refills | Status: DC | PRN
Start: 2022-12-30 — End: 2023-01-19

## 2022-12-30 NOTE — Assessment & Plan Note (Addendum)
Acute, stable. Albuterol nebulizer given in office with moderate improvement, awaiting chest xray. Refill given of albuterol inhaler. Return in 1 week.

## 2022-12-30 NOTE — Progress Notes (Signed)
BP 122/74   Pulse (!) 105   Temp 98.1 F (36.7 C) (Oral)   Ht 5' 4.57" (1.64 m)   Wt 193 lb (87.5 kg)   LMP  (LMP Unknown)   SpO2 93%   BMI 32.55 kg/m    Subjective:    Patient ID: [name], female    DOB: 12/28/1960, 62 y.o.   MRN: [id number]  HPI: LEELU NISLY is a 62 y.o. female  Chief Complaint  Patient presents with   URI   UPPER RESPIRATORY TRACT INFECTION Started 3 days ago, hard to fall asleep due to coughing. Took an at home COVID test 2& 3 days ago, both were negative.  Worst symptom:Coughing Fever: no Cough: yes Shortness of breath: yes Wheezing: yes Chest pain: no Chest tightness: no Chest congestion: yes Nasal congestion: yes Runny nose: yes Post nasal drip: yes Sneezing: yes Sore throat: no Swollen glands: no Sinus pressure: yes Headache: no Face pain: no Toothache: no Ear pain: yes "right Ear pressure: no  Eyes red/itching:no Eye drainage/crusting: no  Vomiting: no Rash: no Fatigue: yes  Sick contacts: no Strep contacts: no  Context: worse Recurrent sinusitis: no Relief with OTC cold/cough medications: no  Treatments attempted:  Delsym cough, and Alka seltzer     Relevant past medical, surgical, family and social history reviewed and updated as indicated. Interim medical history since our last visit reviewed. Allergies and medications reviewed and updated.  Review of Systems  Constitutional:  Positive for fatigue. Negative for chills and fever.  HENT:  Positive for congestion, ear pain, postnasal drip, rhinorrhea, sinus pressure and sneezing. Negative for sinus pain and sore throat.   Eyes:  Negative for discharge, redness and itching.  Respiratory:  Positive for cough, shortness of breath and wheezing. Negative for chest tightness.   Cardiovascular:  Negative for chest pain.  Gastrointestinal:  Negative for vomiting.  Skin:  Negative for rash.  Neurological:  Negative for headaches.    Per HPI unless specifically indicated  above     Objective:    BP 122/74   Pulse (!) 105   Temp 98.1 F (36.7 C) (Oral)   Ht 5' 4.57" (1.64 m)   Wt 193 lb (87.5 kg)   LMP  (LMP Unknown)   SpO2 93%   BMI 32.55 kg/m   Wt Readings from Last 3 Encounters:  12/30/22 193 lb (87.5 kg)  10/28/22 197 lb 6.4 oz (89.5 kg)  07/21/22 197 lb 6.4 oz (89.5 kg)    Physical Exam Vitals and nursing note reviewed.  Constitutional:      General: She is awake. She is not in acute distress.    Appearance: Normal appearance. She is well-developed and well-groomed. She is obese. She is not ill-appearing.  HENT:     Head: Normocephalic and atraumatic.     Right Ear: Hearing and external ear normal. No drainage. Tympanic membrane is not erythematous.     Left Ear: Hearing and external ear normal. No drainage. Tympanic membrane is not erythematous.     Nose: Congestion and rhinorrhea present.     Right Turbinates: Swollen and pale.     Left Turbinates: Swollen and pale.     Right Sinus: Maxillary sinus tenderness present. No frontal sinus tenderness.     Left Sinus: Maxillary sinus tenderness present. No frontal sinus tenderness.     Mouth/Throat:     Pharynx: Oropharynx is clear. Postnasal drip present. No posterior oropharyngeal erythema.  Eyes:  General: Lids are normal.        Right eye: No discharge.        Left eye: No discharge.     Conjunctiva/sclera: Conjunctivae normal.  Cardiovascular:     Rate and Rhythm: Regular rhythm. Tachycardia present.     Pulses:          Radial pulses are 2+ on the right side and 2+ on the left side.       Posterior tibial pulses are 2+ on the right side and 2+ on the left side.     Heart sounds: Normal heart sounds, S1 normal and S2 normal. No murmur heard.    No gallop.  Pulmonary:     Effort: Pulmonary effort is normal. No accessory muscle usage or respiratory distress.     Breath sounds: Wheezing and rales present. No rhonchi.  Musculoskeletal:        General: Normal range of motion.      Cervical back: Full passive range of motion without pain and normal range of motion.     Right lower leg: No edema.     Left lower leg: No edema.  Skin:    General: Skin is warm and dry.     Capillary Refill: Capillary refill takes less than 2 seconds.  Neurological:     Mental Status: She is alert and oriented to person, place, and time.  Psychiatric:        Attention and Perception: Attention normal.        Mood and Affect: Mood normal.        Speech: Speech normal.        Behavior: Behavior normal. Behavior is cooperative.        Thought Content: Thought content normal.     Results for orders placed or performed in visit on 12/04/22  HM MAMMOGRAPHY  Result Value Ref Range   HM Mammogram 0-4 Bi-Rad 0-4 Bi-Rad, Self Reported Normal      Assessment & Plan:   Problem List Items Addressed This Visit     Acute URI - Primary    Acute, ongoing. Flu negative. STAT chest xray ordered to rule out pneumonia. Albuterol nebulizer given in office for wheezing. Recommend 5 day course of 40 MG Prednisone, Tussionex as needed at bedtime for cough, warm liquids such as hot tea/soup, plenty of rest, and hydration. Refill given for albuterol inhaler, work note provided. Return in 1 week, call sooner if concerns arise.       Relevant Orders   Veritor Flu A/B Waived   Wheezing    Acute, stable. Albuterol nebulizer given in office with moderate improvement, awaiting chest xray. Refill given of albuterol inhaler. Return in 1 week.       Relevant Medications   albuterol (PROVENTIL) (2.5 MG/3ML) 0.083% nebulizer solution 2.5 mg   Other Relevant Orders   DG Chest 2 View     Follow up plan: Return in about 1 week (around 01/06/2023) for Follow up lung recheck.

## 2022-12-30 NOTE — Assessment & Plan Note (Addendum)
Acute, ongoing. Flu negative. STAT chest xray ordered to rule out pneumonia. Albuterol nebulizer given in office for wheezing. Recommend 5 day course of 40 MG Prednisone, Tussionex as needed at bedtime for cough, warm liquids such as hot tea/soup, plenty of rest, and hydration. Refill given for albuterol inhaler, work note provided. Return in 1 week, call sooner if concerns arise.

## 2022-12-30 NOTE — Patient Instructions (Signed)
Warm liquids hot tea and hot soup, chicken/beef broth  Plenty of water to prevent dehydration   Try BRAT diet: bananas, rice, applesauce, and toast for decreased appetite  Try humidifier use at night.

## 2023-01-01 NOTE — Progress Notes (Signed)
Hi Carrie Arroyo, your chest xray returned as normal. It does not indicate that you have pneumonia. Continue with recommended plan as discussed and follow up in one week. Thank you for allowing me to participate in your care.

## 2023-01-08 ENCOUNTER — Ambulatory Visit: Payer: BC Managed Care – PPO | Admitting: Family Medicine

## 2023-01-09 DIAGNOSIS — H6121 Impacted cerumen, right ear: Secondary | ICD-10-CM | POA: Diagnosis not present

## 2023-01-09 DIAGNOSIS — H6981 Other specified disorders of Eustachian tube, right ear: Secondary | ICD-10-CM | POA: Diagnosis not present

## 2023-01-09 DIAGNOSIS — J301 Allergic rhinitis due to pollen: Secondary | ICD-10-CM | POA: Diagnosis not present

## 2023-01-09 DIAGNOSIS — D110 Benign neoplasm of parotid gland: Secondary | ICD-10-CM | POA: Diagnosis not present

## 2023-01-09 DIAGNOSIS — J069 Acute upper respiratory infection, unspecified: Secondary | ICD-10-CM | POA: Diagnosis not present

## 2023-01-18 ENCOUNTER — Other Ambulatory Visit: Payer: Self-pay | Admitting: Family Medicine

## 2023-01-19 IMAGING — DX DG SHOULDER 2+V*L*
4 series · 4 of 4 positions shown · non-contrast
Comparison: None.

CLINICAL DATA: Fall left shoulder pain.

EXAM:
LEFT SHOULDER - 2+ VIEW

[shoulder ap]
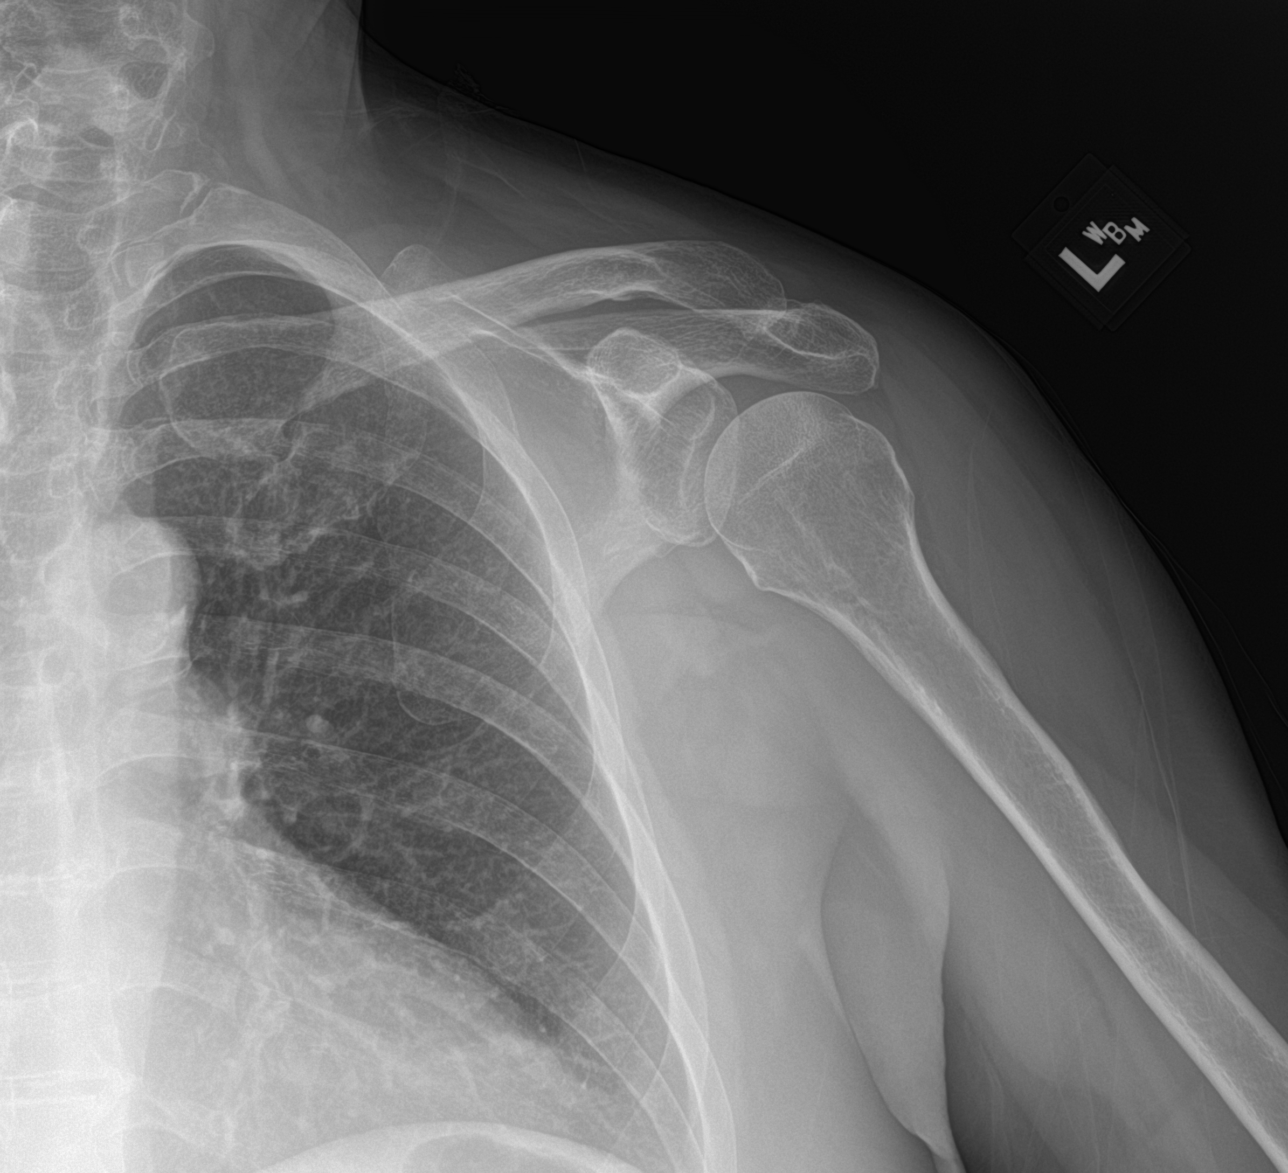

[shoulder y-view]
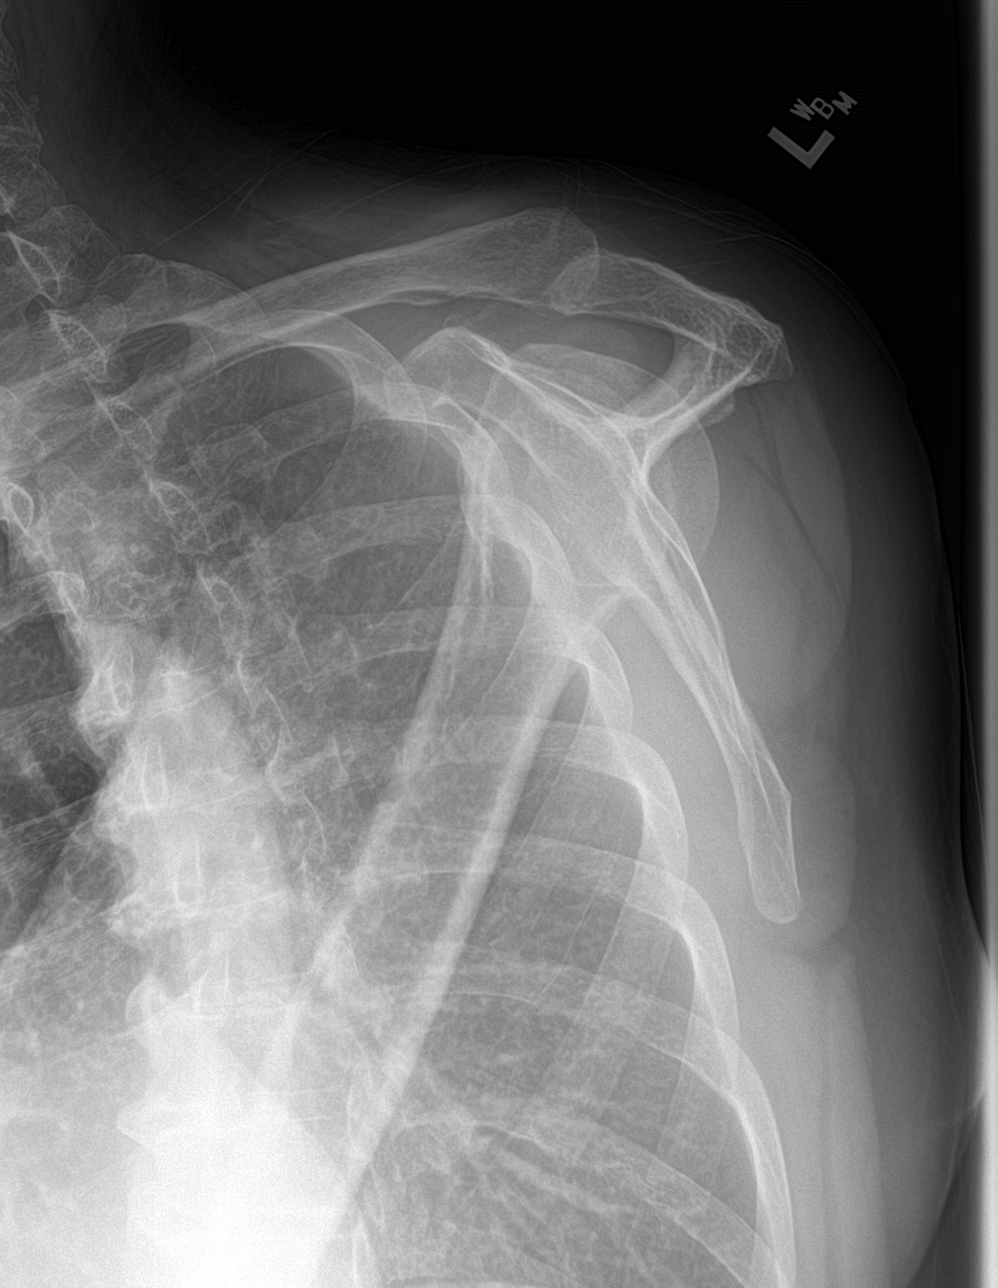

[shoulder axial (1 of 2)]
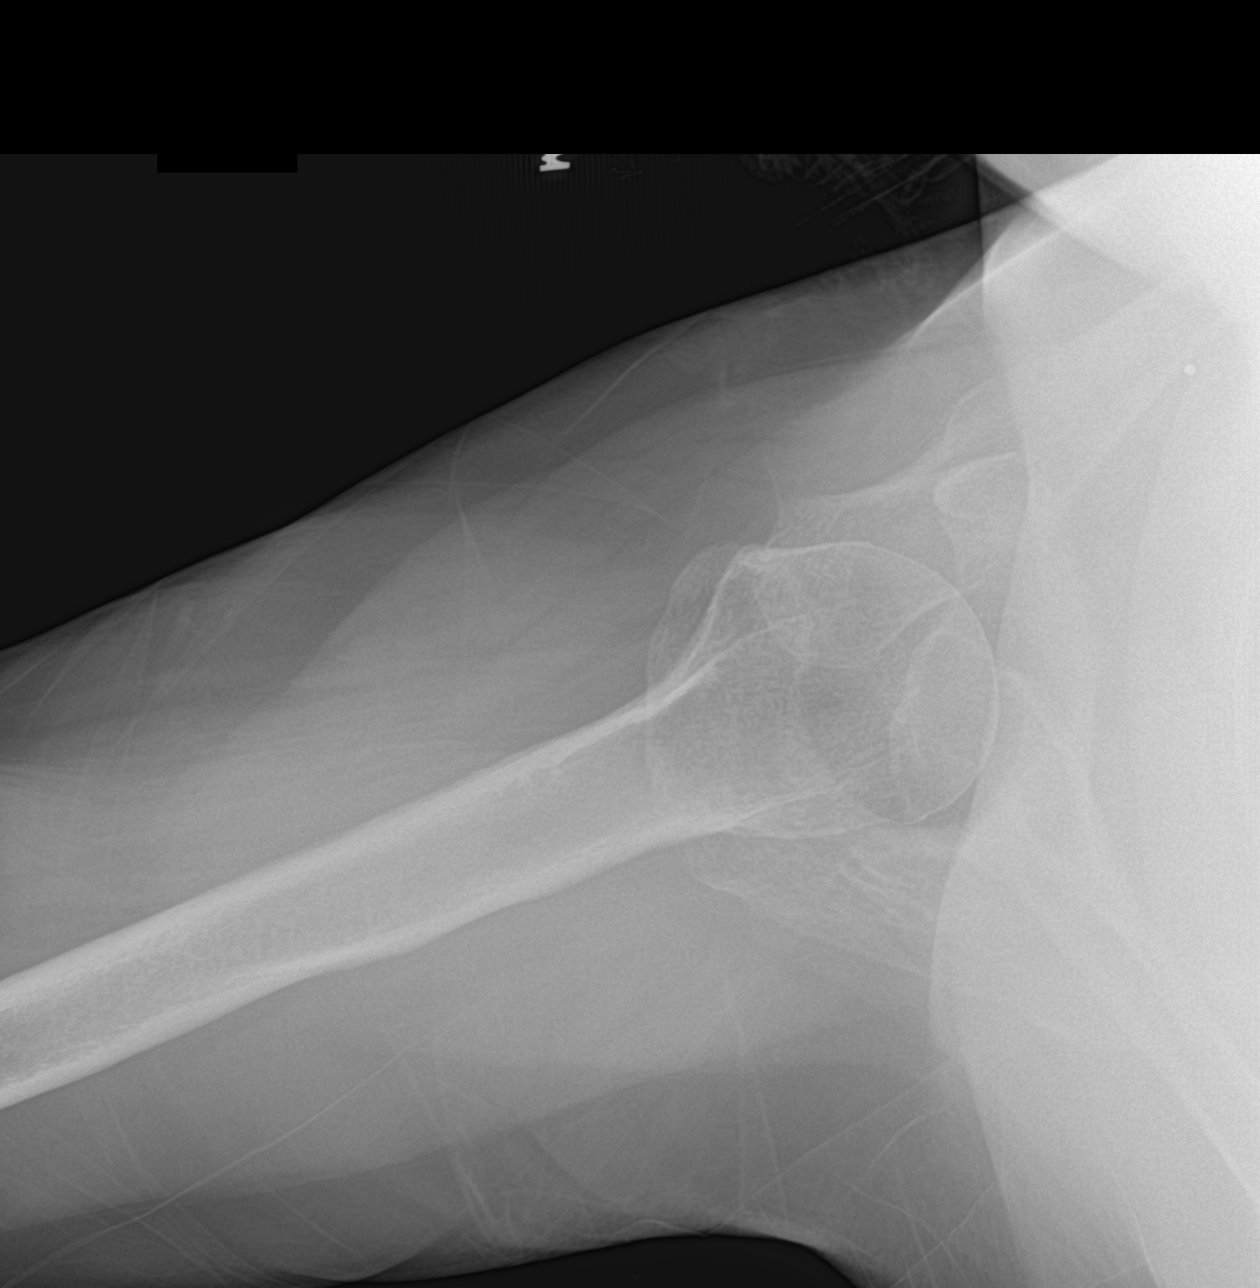

[shoulder axial (2 of 2)]
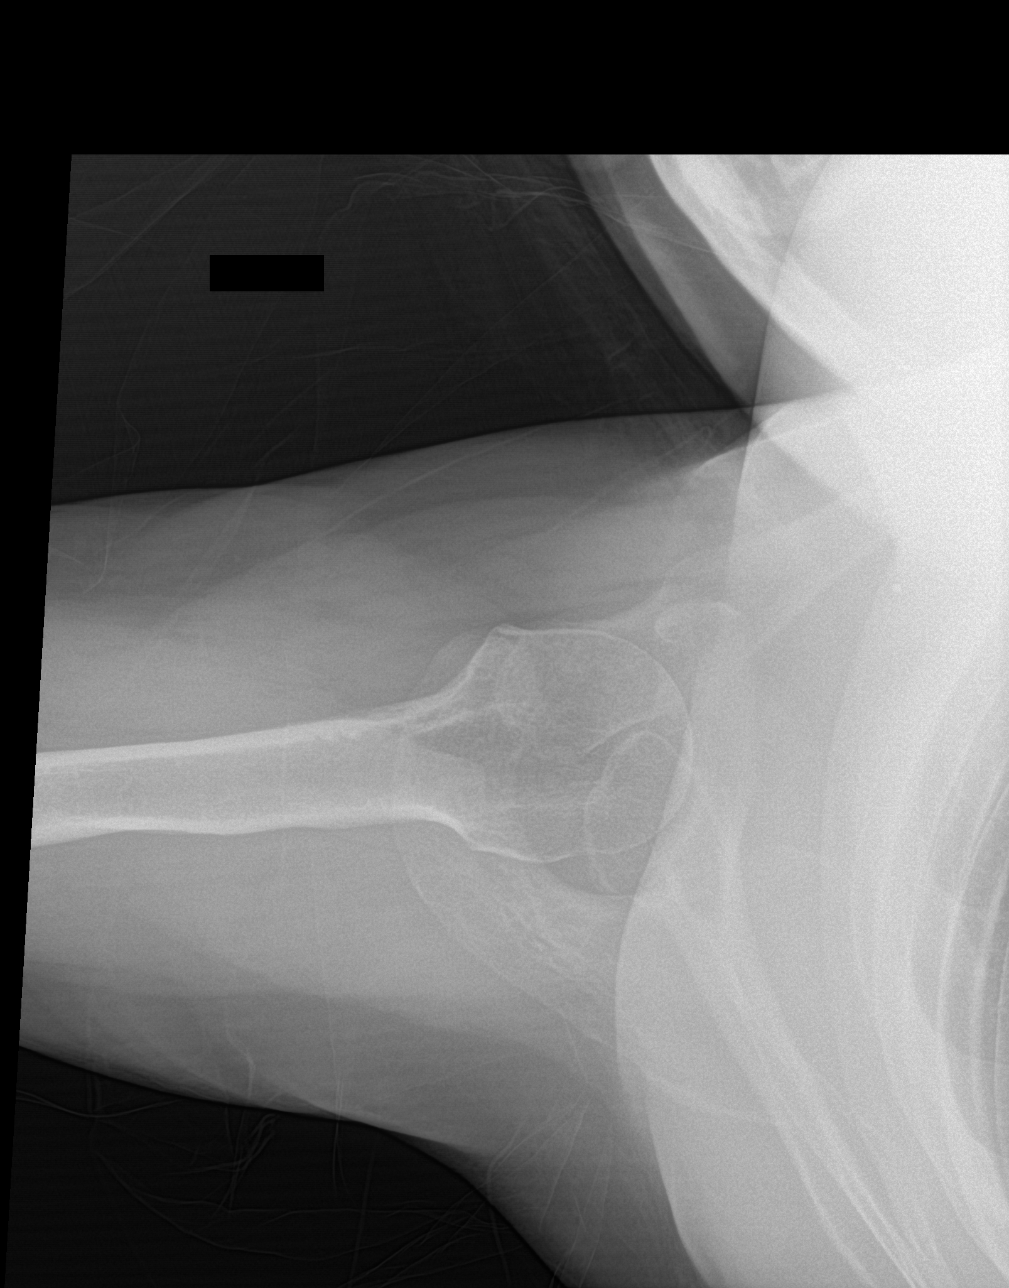

[4 of 4 positions shown; findings below may reference images not displayed]

FINDINGS: There is no evidence of fracture or dislocation. There is no
evidence of arthropathy or other focal bone abnormality. Soft
tissues are unremarkable.
IMPRESSION: Negative.

## 2023-01-19 IMAGING — DX DG RIBS 2V*L*
4 series · 4 of 4 positions shown · non-contrast
Comparison: Chest x-ray 03/24/2012

CLINICAL DATA: Fall.  Pain of left breast area and laterally.

EXAM:
LEFT RIBS - 2 VIEW

[rib pa (1 of 2)]
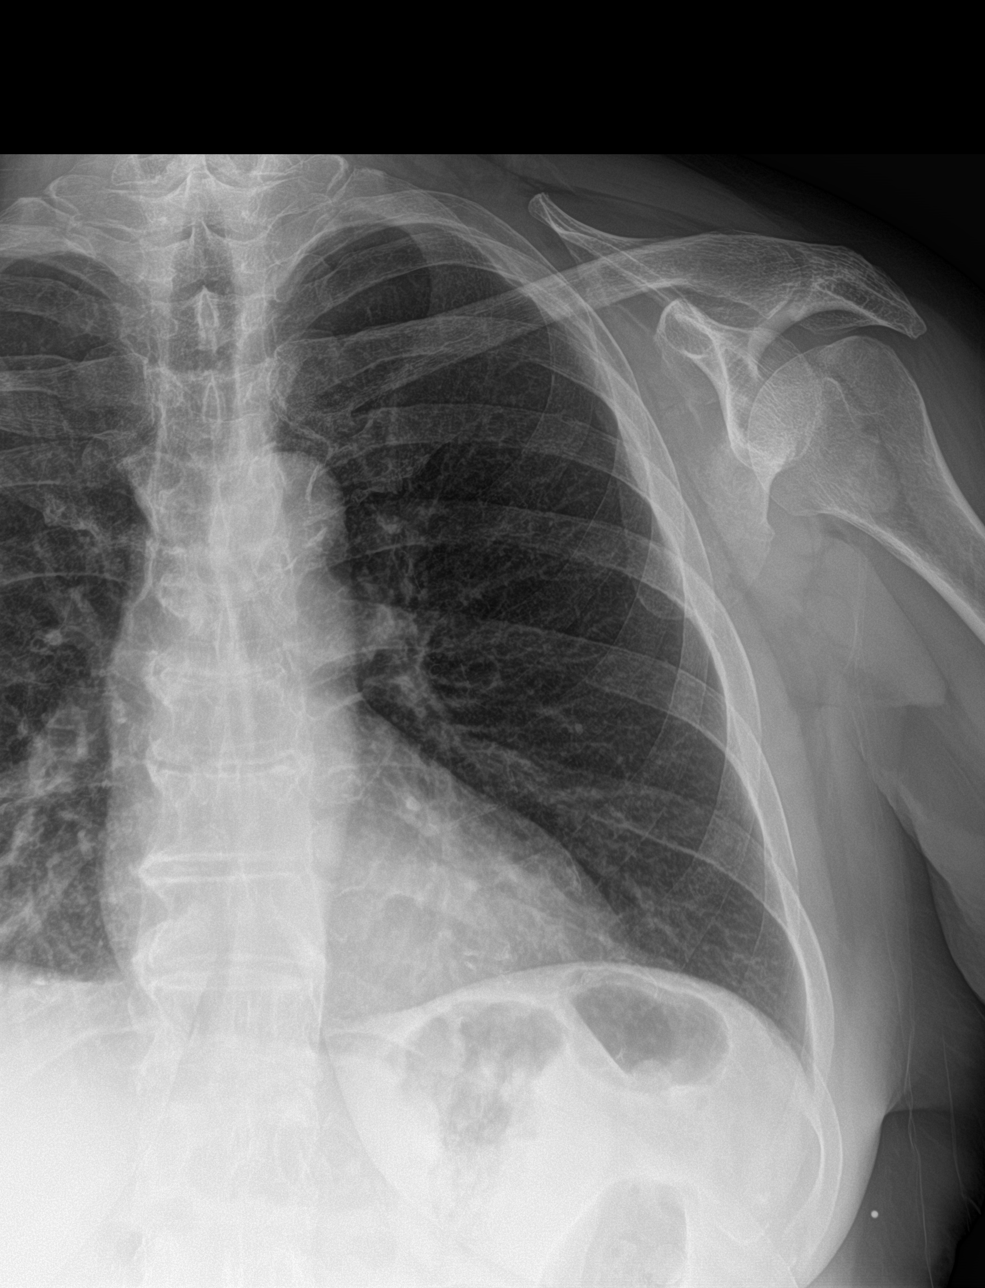

[rib pa (2 of 2)]
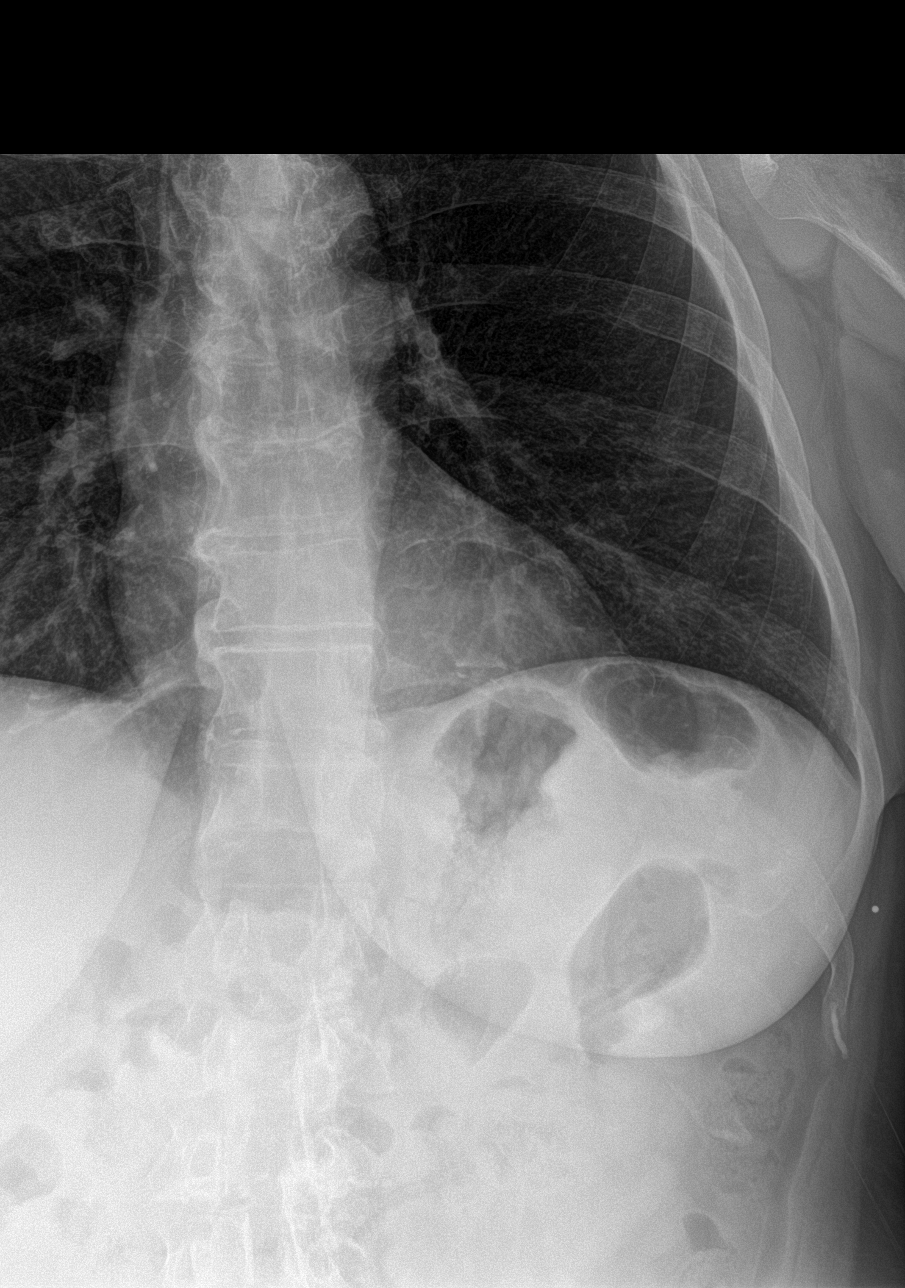

[rib obl (1 of 2)]
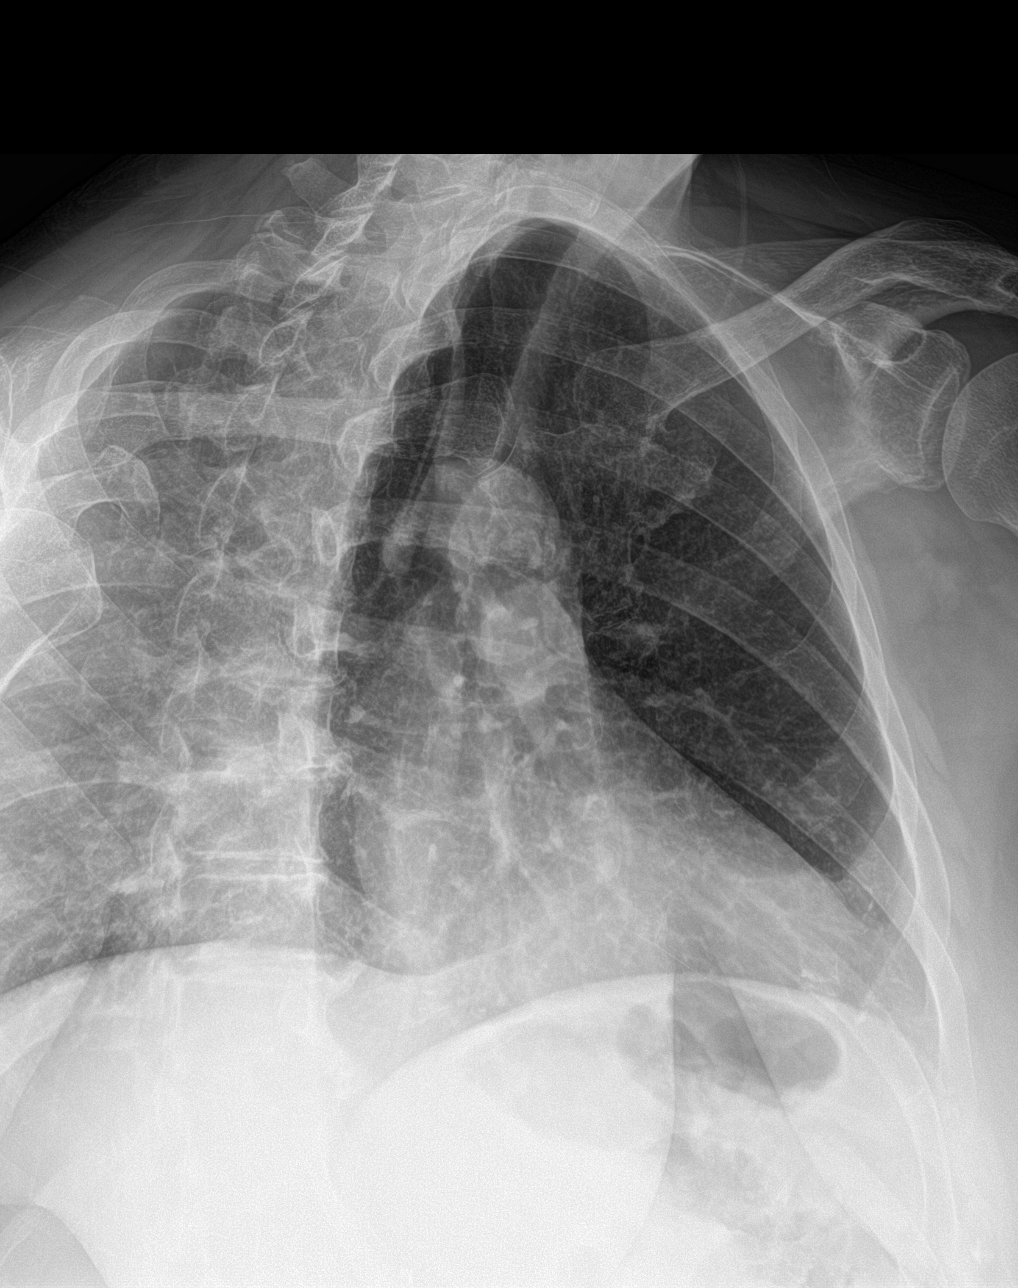

[rib obl (2 of 2)]
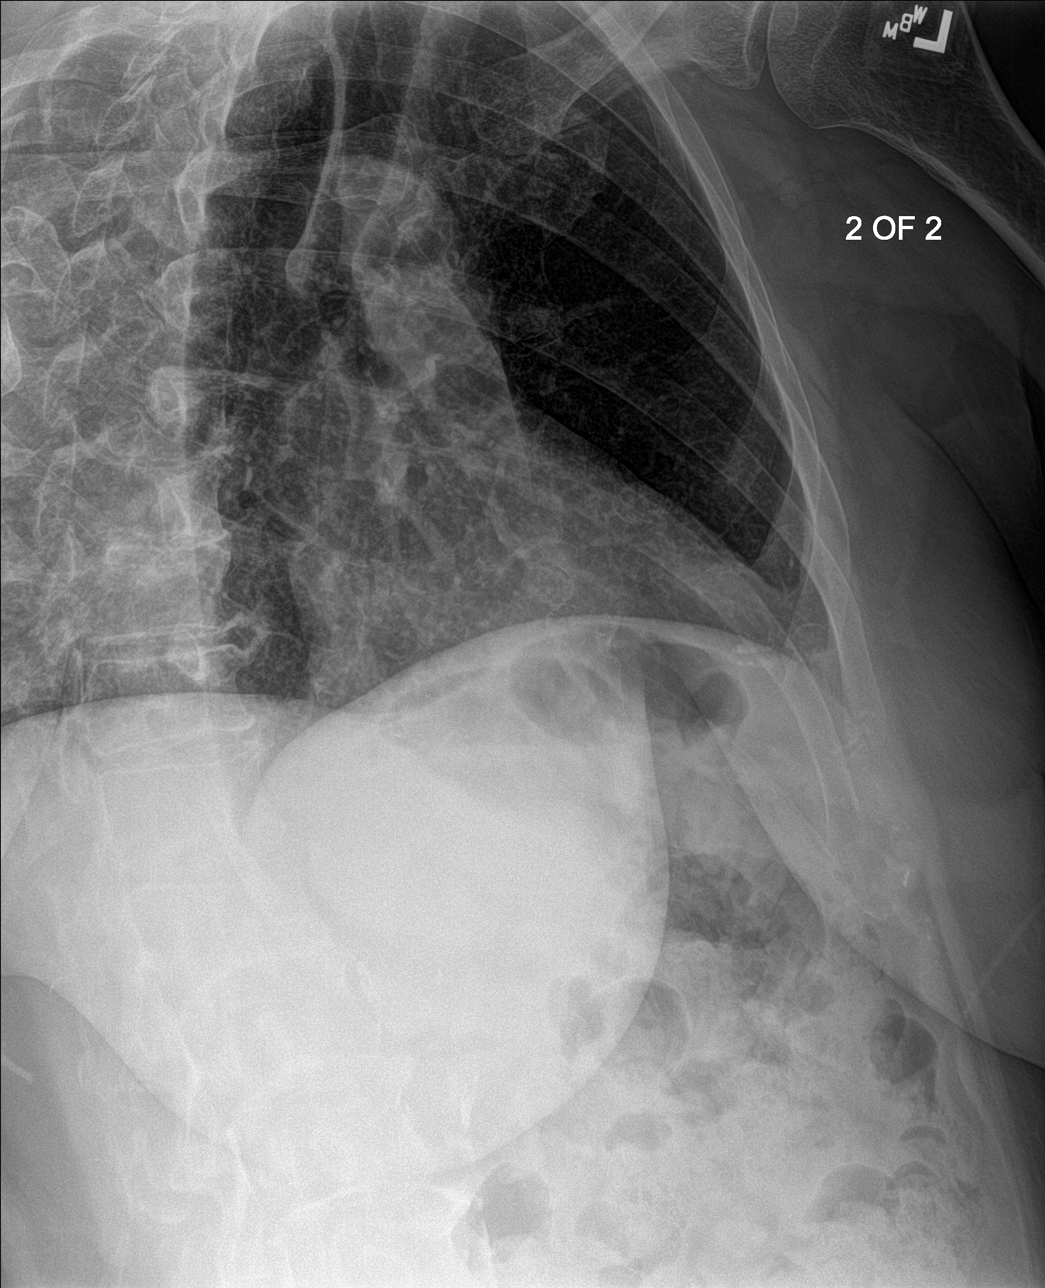

[4 of 4 positions shown; findings below may reference images not displayed]

FINDINGS: Metallic BB marker overlies the lower left ribs. No acute displaced
fracture or other bone lesions are seen involving the ribs.
IMPRESSION: No acute displaced rib fracture on the left. Please note,
nondisplaced rib fractures may be occult on radiograph.

## 2023-01-19 NOTE — Telephone Encounter (Signed)
Requested Prescriptions  Pending Prescriptions Disp Refills   albuterol (VENTOLIN HFA) 108 (90 Base) MCG/ACT inhaler [Pharmacy Med Name: ALBUTEROL HFA (PROVENTIL) INH] 6.7 each 0    Sig: TAKE 2 PUFFS BY MOUTH EVERY 6 HOURS AS NEEDED FOR WHEEZE OR SHORTNESS OF BREATH     Pulmonology:  Beta Agonists 2 Passed - 01/18/2023  1:17 AM      Passed - Last BP in normal range    BP Readings from Last 1 Encounters:  12/30/22 122/74         Passed - Last Heart Rate in normal range    Pulse Readings from Last 1 Encounters:  12/30/22 (!) 105         Passed - Valid encounter within last 12 months    Recent Outpatient Visits           2 weeks ago Acute URI   [place] [facility] [place], Sherran Needs, NP   2 months ago Urinary symptom or sign   [place] [facility] [place], [place] T, NP   6 months ago Primary hypertension   [place] [facility] [place], [place] T, NP   12 months ago Primary hypertension   [place] [facility] [place], Corrie Dandy T, NP   1 year ago Primary hypertension   [place] [facility] [place], Dorie Rank, NP       Future Appointments             In 1 week Cannady, Dorie Rank, NP  [facility], [facility]

## 2023-01-21 ENCOUNTER — Encounter: Payer: BC Managed Care – PPO | Admitting: Nurse Practitioner

## 2023-01-23 ENCOUNTER — Other Ambulatory Visit: Payer: Self-pay | Admitting: Nurse Practitioner

## 2023-01-23 NOTE — Telephone Encounter (Signed)
Requested Prescriptions  Pending Prescriptions Disp Refills   simvastatin (ZOCOR) 40 MG tablet [Pharmacy Med Name: SIMVASTATIN 40 MG TABLET] 90 tablet 0    Sig: TAKE 1 TABLET BY MOUTH EVERYDAY AT BEDTIME     Cardiovascular:  Antilipid - Statins Failed - 01/23/2023  2:28 AM      Failed - Lipid Panel in normal range within the last 12 months    Cholesterol, Total  Date Value Ref Range Status  07/21/2022 147 100 - 199 mg/dL Final   Cholesterol Piccolo, Waived  Date Value Ref Range Status  01/15/2015 179 <200 mg/dL Final    Comment:                            Desirable                <200                         Borderline High      200- 239                         High                     >239    LDL Chol Calc (NIH)  Date Value Ref Range Status  07/21/2022 80 0 - 99 mg/dL Final   HDL  Date Value Ref Range Status  07/21/2022 44 >39 mg/dL Final   Triglycerides  Date Value Ref Range Status  07/21/2022 130 0 - 149 mg/dL Final   Triglycerides Piccolo,Waived  Date Value Ref Range Status  01/15/2015 147 <150 mg/dL Final    Comment:                            Normal                   <150                         Borderline High     150 - 199                         High                200 - 499                         Very High                >499          Passed - Patient is not pregnant      Passed - Valid encounter within last 12 months    Recent Outpatient Visits           3 weeks ago Acute URI   [place] [facility] [place], Sherran Needs, NP   2 months ago Urinary symptom or sign   [facility] [place], [place] T, NP   6 months ago Primary hypertension   [place] [facility] [place], Corrie Dandy T, NP   1 year ago Primary hypertension   [place] [facility] [place], Corrie Dandy T, NP   1 year ago Primary hypertension    [facility] [place],  Dorie Rank, NP       Future  Appointments             In 4 days Cannady, Dorie Rank, NP [place] [facility], [facility]             benazepril (LOTENSIN) 20 MG tablet [Pharmacy Med Name: BENAZEPRIL HCL 20 MG TABLET] 90 tablet 0    Sig: TAKE 1 TABLET BY MOUTH EVERY DAY     Cardiovascular:  ACE Inhibitors Failed - 01/23/2023  2:28 AM      Failed - Cr in normal range and within 180 days    Creatinine, Ser  Date Value Ref Range Status  07/21/2022 0.65 0.57 - 1.00 mg/dL Final         Failed - K in normal range and within 180 days    Potassium  Date Value Ref Range Status  07/21/2022 3.9 3.5 - 5.2 mmol/L Final         Passed - Patient is not pregnant      Passed - Last BP in normal range    BP Readings from Last 1 Encounters:  12/30/22 122/74         Passed - Valid encounter within last 6 months    Recent Outpatient Visits           3 weeks ago Acute URI   [place] [facility] [place], Sherran Needs, NP   2 months ago Urinary symptom or sign   [employer] [facility] [place], [place] T, NP   6 months ago Primary hypertension   [place] [facility] [place], Corrie Dandy T, NP   1 year ago Primary hypertension   [place] [facility] [place], Corrie Dandy T, NP   1 year ago Primary hypertension   [place] [facility] [place], Dorie Rank, NP       Future Appointments             In 4 days Marjie Skiff, NP [place] [facility], [facility]

## 2023-01-25 NOTE — Patient Instructions (Signed)
Be Involved in Caring For Your Health:  Taking Medications When medications are taken as directed, they can greatly improve your health. But if they are not taken as prescribed, they may not work. In some cases, not taking them correctly can be harmful. To help ensure your treatment remains effective and safe, understand your medications and how to take them. Bring your medications to each visit for review by your provider.  Your lab results, notes, and after visit summary will be available on My Chart. We strongly encourage you to use this feature. If lab results are abnormal the clinic will contact you with the appropriate steps. If the clinic does not contact you assume the results are satisfactory. You can always view your results on My Chart. If you have questions regarding your health or results, please contact the clinic during office hours. You can also ask questions on My Chart.  We at [facility] are grateful that you chose [place] to provide your care. We strive to provide evidence-based and compassionate care and are always looking for feedback. If you get a survey from the clinic please complete this so we can hear your opinions.  Healthy Eating, Adult Healthy eating may help you get and keep a healthy body weight, reduce the risk of chronic disease, and live a long and productive life. It is important to follow a healthy eating pattern. Your nutritional and calorie needs should be met mainly by different nutrient-rich foods. What are tips for following this plan? Reading food labels Read labels and choose the following: Reduced or low sodium products. Juices with 100% fruit juice. Foods with low saturated fats (<3 g per serving) and high polyunsaturated and monounsaturated fats. Foods with whole grains, such as whole wheat, cracked wheat, brown rice, and wild rice. Whole grains that are fortified with folic acid. This is recommended for females who are pregnant or who want to  become pregnant. Read labels and do not eat or drink the following: Foods or drinks with added sugars. These include foods that contain brown sugar, corn sweetener, corn syrup, dextrose, fructose, glucose, high-fructose corn syrup, honey, invert sugar, lactose, malt syrup, maltose, molasses, raw sugar, sucrose, trehalose, or turbinado sugar. Limit your intake of added sugars to less than 10% of your total daily calories. Do not eat more than the following amounts of added sugar per day: 6 teaspoons (25 g) for females. 9 teaspoons (38 g) for males. Foods that contain processed or refined starches and grains. Refined grain products, such as white flour, degermed cornmeal, white bread, and white rice. Shopping Choose nutrient-rich snacks, such as vegetables, whole fruits, and nuts. Avoid high-calorie and high-sugar snacks, such as potato chips, fruit snacks, and candy. Use oil-based dressings and spreads on foods instead of solid fats such as butter, margarine, sour cream, or cream cheese. Limit pre-made sauces, mixes, and "instant" products such as flavored rice, instant noodles, and ready-made pasta. Try more plant-protein sources, such as tofu, tempeh, black beans, edamame, lentils, nuts, and seeds. Explore eating plans such as the Mediterranean diet or vegetarian diet. Try heart-healthy dips made with beans and healthy fats like hummus and guacamole. Vegetables go great with these. Cooking Use oil to saut or stir-fry foods instead of solid fats such as butter, margarine, or lard. Try baking, boiling, grilling, or broiling instead of frying. Remove the fatty part of meats before cooking. Steam vegetables in water or broth. Meal planning  At meals, imagine dividing your plate into fourths: One-half of  your plate is fruits and vegetables. One-fourth of your plate is whole grains. One-fourth of your plate is protein, especially lean meats, poultry, eggs, tofu, beans, or nuts. Include low-fat  dairy as part of your daily diet. Lifestyle Choose healthy options in all settings, including home, work, school, restaurants, or stores. Prepare your food safely: Wash your hands after handling raw meats. Where you prepare food, keep surfaces clean by regularly washing with hot, soapy water. Keep raw meats separate from ready-to-eat foods, such as fruits and vegetables. Cook seafood, meat, poultry, and eggs to the recommended temperature. Get a food thermometer. Store foods at safe temperatures. In general: Keep cold foods at 48F (4.4C) or below. Keep hot foods at 148F (60C) or above. Keep your freezer at [facility] (-17.8C) or below. Foods are not safe to eat if they have been between the temperatures of 40-148F (4.4-60C) for more than 2 hours. What foods should I eat? Fruits Aim to eat 1-2 cups of fresh, canned (in natural juice), or frozen fruits each day. One cup of fruit equals 1 small apple, 1 large banana, 8 large strawberries, 1 cup (237 g) canned fruit,  cup (82 g) dried fruit, or 1 cup (240 mL) 100% juice. Vegetables Aim to eat 2-4 cups of fresh and frozen vegetables each day, including different varieties and colors. One cup of vegetables equals 1 cup (91 g) broccoli or cauliflower florets, 2 medium carrots, 2 cups (150 g) raw, leafy greens, 1 large tomato, 1 large bell pepper, 1 large sweet potato, or 1 medium white potato. Grains Aim to eat 5-10 ounce-equivalents of whole grains each day. Examples of 1 ounce-equivalent of grains include 1 slice of bread, 1 cup (40 g) ready-to-eat cereal, 3 cups (24 g) popcorn, or  cup (93 g) cooked rice. Meats and other proteins Try to eat 5-7 ounce-equivalents of protein each day. Examples of 1 ounce-equivalent of protein include 1 egg,  oz nuts (12 almonds, 24 pistachios, or 7 walnut halves), 1/4 cup (90 g) cooked beans, 6 tablespoons (90 g) hummus or 1 tablespoon (16 g) peanut butter. A cut of meat or fish that is the size of a deck of  cards is about 3-4 ounce-equivalents (85 g). Of the protein you eat each week, try to have at least 8 sounce (227 g) of seafood. This is about 2 servings per week. This includes salmon, trout, herring, sardines, and anchovies. Dairy Aim to eat 3 cup-equivalents of fat-free or low-fat dairy each day. Examples of 1 cup-equivalent of dairy include 1 cup (240 mL) milk, 8 ounces (250 g) yogurt, 1 ounces (44 g) natural cheese, or 1 cup (240 mL) fortified soy milk. Fats and oils Aim for about 5 teaspoons (21 g) of fats and oils per day. Choose monounsaturated fats, such as canola and olive oils, mayonnaise made with olive oil or avocado oil, avocados, peanut butter, and most nuts, or polyunsaturated fats, such as sunflower, corn, and soybean oils, walnuts, pine nuts, sesame seeds, sunflower seeds, and flaxseed. Beverages Aim for 6 eight-ounce glasses of water per day. Limit coffee to 3-5 eight-ounce cups per day. Limit caffeinated beverages that have added calories, such as soda and energy drinks. If you drink alcohol: Limit how much you have to: 0-1 drink a day if you are female. 0-2 drinks a day if you are female. Know how much alcohol is in your drink. In the [place] one drink is one 12 oz bottle of beer (355 mL), one 5 oz glass of wine (  148 mL), or one 1 oz glass of hard liquor (44 mL). Seasoning and other foods Try not to add too much salt to your food. Try using herbs and spices instead of salt. Try not to add sugar to food. This information is based on [place] nutrition guidelines. To learn more, visit [employer]. Exact amounts may vary. You may need different amounts. This information is not intended to replace advice given to you by your health care provider. Make sure you discuss any questions you have with your health care provider. Document Revised: 11/18/2021 Document Reviewed: 11/18/2021 Elsevier Patient Education  2024 [employer].

## 2023-01-27 ENCOUNTER — Encounter: Payer: Self-pay | Admitting: Nurse Practitioner

## 2023-01-27 ENCOUNTER — Ambulatory Visit (INDEPENDENT_AMBULATORY_CARE_PROVIDER_SITE_OTHER): Payer: BC Managed Care – PPO | Admitting: Nurse Practitioner

## 2023-01-27 VITALS — BP 124/79 | HR 90 | Temp 98.1°F | Ht 64.5 in | Wt 191.6 lb

## 2023-01-27 DIAGNOSIS — G4733 Obstructive sleep apnea (adult) (pediatric): Secondary | ICD-10-CM | POA: Diagnosis not present

## 2023-01-27 DIAGNOSIS — I10 Essential (primary) hypertension: Secondary | ICD-10-CM | POA: Diagnosis not present

## 2023-01-27 DIAGNOSIS — Z0000 Encounter for general adult medical examination without abnormal findings: Secondary | ICD-10-CM | POA: Diagnosis not present

## 2023-01-27 DIAGNOSIS — E559 Vitamin D deficiency, unspecified: Secondary | ICD-10-CM | POA: Diagnosis not present

## 2023-01-27 DIAGNOSIS — Z6831 Body mass index (BMI) 31.0-31.9, adult: Secondary | ICD-10-CM

## 2023-01-27 DIAGNOSIS — E6609 Other obesity due to excess calories: Secondary | ICD-10-CM

## 2023-01-27 DIAGNOSIS — J0110 Acute frontal sinusitis, unspecified: Secondary | ICD-10-CM

## 2023-01-27 DIAGNOSIS — K219 Gastro-esophageal reflux disease without esophagitis: Secondary | ICD-10-CM

## 2023-01-27 DIAGNOSIS — E7800 Pure hypercholesterolemia, unspecified: Secondary | ICD-10-CM

## 2023-01-27 DIAGNOSIS — R7303 Prediabetes: Secondary | ICD-10-CM

## 2023-01-27 DIAGNOSIS — E66811 Obesity, class 1: Secondary | ICD-10-CM

## 2023-01-27 DIAGNOSIS — R7989 Other specified abnormal findings of blood chemistry: Secondary | ICD-10-CM

## 2023-01-27 MED ORDER — SIMVASTATIN 40 MG PO TABS: ORAL_TABLET | ORAL | 4 refills | Status: DC

## 2023-01-27 MED ORDER — AZITHROMYCIN 250 MG PO TABS: ORAL_TABLET | ORAL | 0 refills | Status: AC

## 2023-01-27 MED ORDER — BENAZEPRIL HCL 20 MG PO TABS: 20.0000 mg | ORAL_TABLET | Freq: Every day | ORAL | 4 refills | Status: DC

## 2023-01-27 NOTE — Assessment & Plan Note (Signed)
BMI 32.38.  Recommended eating smaller high protein, low fat meals more frequently and exercising 30 mins a day 5 times a week with a goal of 10-15lb weight loss in the next 3 months. Patient voiced their understanding and motivation to adhere to these recommendations.

## 2023-01-27 NOTE — Assessment & Plan Note (Signed)
Chronic, stable.  BP at goal in office and at home.  Recommend she monitor BP at least a few mornings a week at home and document.  DASH diet at home.  Continue current medication regimen and adjust as needed.  Labs today: CBC, CMP, TSH.  Return in 6 months.

## 2023-01-27 NOTE — Progress Notes (Signed)
BP 124/79   Pulse 90   Temp 98.1 F (36.7 C) (Oral)   Ht 5' 4.5" (1.638 m)   Wt 191 lb 9.6 oz (86.9 kg)   LMP  (LMP Unknown)   SpO2 99%   BMI 32.38 kg/m    Subjective:    Patient ID: Carrie Arroyo, female    DOB: 1960/11/29, 62 y.o.   MRN: [id number]  HPI: Carrie Arroyo is a 62 y.o. female presenting on 01/27/2023 for comprehensive medical examination. Current medical complaints include:none  She currently lives with: husband Menopausal Symptoms: no  She reports ENT gave her another round of Prednisone recently for sinus infection, but this is not helping.  Has had two rounds of steroids for sinus infection.    HYPERTENSION / HYPERLIPIDEMIA Taking Benazepril, Simvastatin, ASA.    Non smoker (former smoker -- in college years). Saw neurology on 02/27/2022 for OSA and reports using CPAP nightly.  History of Werner's Syndrome. Satisfied with current treatment? yes Duration of hypertension: chronic BP monitoring frequency: once a week BP range: 120-130/70 range at home BP medication side effects: no Duration of hyperlipidemia: chronic Cholesterol medication side effects: no Cholesterol supplements: none Medication compliance: good compliance Aspirin: yes Recent stressors: no Recurrent headaches: no Visual changes: no Palpitations: no Dyspnea: no Chest pain: no Lower extremity edema: no Dizzy/lightheaded: no  The 10-year ASCVD risk score (Arnett DK, et al., 2019) is: 4.7%   Values used to calculate the score:     Age: 61 years     Sex: Female     Is Non-Hispanic African American: No     Diabetic: No     Tobacco smoker: No     Systolic Blood Pressure: 124 mmHg     Is BP treated: Yes     HDL Cholesterol: 44 mg/dL     Total Cholesterol: 147 mg/dL    PREDIABETES: W0J 8.1% in May.  History of abnormal thyroid levels, recent over past years have been stable.  Polydipsia/polyuria: no Visual disturbance: no Chest pain: no Paresthesias: no    GERD Continues on  Prilosec, generic.  Tried cutting back slowly, but heart burn returns every time she tries this reduction. GERD control status: stable  Satisfied with current treatment? yes Heartburn frequency: none Medication side effects: no  Medication compliance: stable Previous GERD medications: TUMS Antacid use frequency:  none  Dysphagia: no Odynophagia:  no Hematemesis: no Blood in stool: no EGD: yes   VITAMIN D DEFICIENCY: Taking supplement daily.  Denies any recent falls or fractures.  No increased fatigue or muscle pain.   Depression Screen done today and results listed below:     01/27/2023    8:51 AM 12/30/2022    2:18 PM 10/28/2022    8:28 AM 07/21/2022    9:01 AM 01/20/2022    9:10 AM  Depression screen PHQ 2/9  Decreased Interest 0 0 0 0 0  Down, Depressed, Hopeless 0 0 0 0 0  PHQ - 2 Score 0 0 0 0 0  Altered sleeping 0 0  0 1  Tired, decreased energy 0 0  0 0  Change in appetite 0 0  0 0  Feeling bad or failure about yourself  0 0  0 0  Trouble concentrating 0 0  0 0  Moving slowly or fidgety/restless 0 0  0 0  Suicidal thoughts 0 0  0 0  PHQ-9 Score 0 0  0 1  Difficult doing work/chores Not difficult at all  Not difficult at all  Not difficult at all Not difficult at all      01/27/2023    8:51 AM 12/30/2022    2:18 PM 07/21/2022    9:02 AM 01/20/2022    9:11 AM  GAD 7 : Generalized Anxiety Score  Nervous, Anxious, on Edge 0 0 0 0  Control/stop worrying 0 0 0 0  Worry too much - different things 0 0 0 0  Trouble relaxing 0 0 0 0  Restless 0 0 0 0  Easily annoyed or irritable 0 0 0 0  Afraid - awful might happen 0 0 0 0  Total GAD 7 Score 0 0 0 0  Anxiety Difficulty Not difficult at all Not difficult at all Not difficult at all Not difficult at all      07/16/2021    8:11 AM 01/20/2022    9:10 AM 07/21/2022    9:01 AM 10/28/2022    8:28 AM 01/27/2023    8:51 AM  Fall Risk  Falls in the past year? 0 0 0 0 0  Was there an injury with Fall? 0 0 0 0 0  Fall  Risk Category Calculator 0 0 0 0 0  Fall Risk Category (Retired) Low Low     (RETIRED) Patient Fall Risk Level Low fall risk      Patient at Risk for Falls Due to No Fall Risks No Fall Risks No Fall Risks  No Fall Risks  Fall risk Follow up Falls evaluation completed Falls evaluation completed Falls evaluation completed Falls evaluation completed Falls evaluation completed    Functional Status Survey: Is the patient deaf or have difficulty hearing?: No Does the patient have difficulty seeing, even when wearing glasses/contacts?: No Does the patient have difficulty concentrating, remembering, or making decisions?: No Does the patient have difficulty walking or climbing stairs?: No Does the patient have difficulty dressing or bathing?: No Does the patient have difficulty doing errands alone such as visiting a doctor's office or shopping?: No   Past Medical History:  Past Medical History:  Diagnosis Date   Allergy    Boils    Elevated liver enzymes    GERD (gastroesophageal reflux disease)    Humerus fracture    right   Hypertension    Sleep apnea     Surgical History:  Past Surgical History:  Procedure Laterality Date   APPENDECTOMY     DENTAL SURGERY  01/12/2020   OVARIAN CYST REMOVAL     PAROTIDECTOMY     TOTAL ABDOMINAL HYSTERECTOMY     Medications:  Current Outpatient Medications on File Prior to Visit  Medication Sig   aspirin 81 MG tablet Take 81 mg by mouth daily.   Azelastine HCl 137 MCG/SPRAY SOLN SMARTSIG:1-2 Spray(s) Both Nares Twice Daily   Cholecalciferol (VITAMIN D3) 50 MCG (2000 UT) CAPS Take 1 capsule by mouth daily.   clindamycin (CLEOCIN T) 1 % lotion Apply topically.   clobetasol cream (TEMOVATE) 0.05 % as needed.   loratadine (CLARITIN) 10 MG tablet Take 10 mg by mouth daily.   minocycline (MINOCIN,DYNACIN) 100 MG capsule TAKE ONE CAPSULE BY MOUTH TWICE A DAY FOR FLARES   Multiple Vitamin (MULTIVITAMIN) tablet Take 1 tablet by mouth daily.   omeprazole  (PRILOSEC) 20 MG capsule Take 20 mg by mouth daily.   SODIUM FLUORIDE 5000 PPM 1.1 % PSTE Take by mouth at bedtime.   No current facility-administered medications on file prior to visit.    Allergies:  Allergies  Allergen Reactions   Penicillin G Benzathine    Sulfa Antibiotics     Social History:  Social History   Socioeconomic History   Marital status: Married    Spouse name: Not on file   Number of children: Not on file   Years of education: Not on file   Highest education level: Associate degree: academic program  Occupational History   Not on file  Tobacco Use   Smoking status: Never   Smokeless tobacco: Never  Vaping Use   Vaping status: Never Used  Substance and Sexual Activity   Alcohol use: Yes    Alcohol/week: 2.0 standard drinks of alcohol   Drug use: No   Sexual activity: Not Currently    Birth control/protection: None  Other Topics Concern   Not on file  Social History Narrative   Not on file   Social Determinants of Health   Financial Resource Strain: Low Risk  (12/30/2022)   Overall Financial Resource Strain (CARDIA)    Difficulty of Paying Living Expenses: Not hard at all  Food Insecurity: No Food Insecurity (12/30/2022)   Hunger Vital Sign    Worried About Running Out of Food in the Last Year: Never true    Ran Out of Food in the Last Year: Never true  Transportation Needs: No Transportation Needs (12/30/2022)   PRAPARE - Administrator, Civil Service (Medical): No    Lack of Transportation (Non-Medical): No  Physical Activity: Sufficiently Active (12/30/2022)   Exercise Vital Sign    Days of Exercise per Week: 5 days    Minutes of Exercise per Session: 30 min  Stress: No Stress Concern Present (12/30/2022)   [employer] of Occupational Health - Occupational Stress Questionnaire    Feeling of Stress : Not at all  Social Connections: Unknown (12/30/2022)   Social Connection and Isolation Panel [NHANES]    Frequency of  Communication with Friends and Family: More than three times a week    Frequency of Social Gatherings with Friends and Family: More than three times a week    Attends Religious Services: Patient declined    Database administrator or Organizations: Yes    Attends Banker Meetings: 1 to 4 times per year    Marital Status: Married  Catering manager Violence: Not At Risk (02/25/2022)   Received from [employer], [facility]   HITS    Over the last 12 months how often did your partner physically hurt you?: Never    Over the last 12 months how often did your partner insult you or talk down to you?: Never    Over the last 12 months how often did your partner threaten you with physical harm?: Never    Over the last 12 months how often did your partner scream or curse at you?: Never   Social History   Tobacco Use  Smoking Status Never  Smokeless Tobacco Never   Social History   Substance and Sexual Activity  Alcohol Use Yes   Alcohol/week: 2.0 standard drinks of alcohol    Family History:  Family History  Adopted: Yes  Family history unknown: Yes    Past medical history, surgical history, medications, allergies, family history and social history reviewed with patient today and changes made to appropriate areas of the chart.   Review of Systems - negative All other ROS negative except what is listed above and in the HPI.      Objective:    BP  124/79   Pulse 90   Temp 98.1 F (36.7 C) (Oral)   Ht 5' 4.5" (1.638 m)   Wt 191 lb 9.6 oz (86.9 kg)   LMP  (LMP Unknown)   SpO2 99%   BMI 32.38 kg/m   Wt Readings from Last 3 Encounters:  01/27/23 191 lb 9.6 oz (86.9 kg)  12/30/22 193 lb (87.5 kg)  10/28/22 197 lb 6.4 oz (89.5 kg)    Physical Exam Vitals and nursing note reviewed.  Constitutional:      General: She is awake. She is not in acute distress.    Appearance: She is well-developed and well-groomed. She is obese. She is not ill-appearing or  toxic-appearing.  HENT:     Head: Normocephalic and atraumatic.     Right Ear: Hearing, tympanic membrane, ear canal and external ear normal. No drainage.     Left Ear: Hearing, tympanic membrane, ear canal and external ear normal. No drainage.     Nose: Nose normal.     Right Sinus: No maxillary sinus tenderness or frontal sinus tenderness.     Left Sinus: No maxillary sinus tenderness or frontal sinus tenderness.     Mouth/Throat:     Mouth: Mucous membranes are moist.     Pharynx: Oropharynx is clear. Uvula midline. No pharyngeal swelling, oropharyngeal exudate or posterior oropharyngeal erythema.  Eyes:     General: Lids are normal.        Right eye: No discharge.        Left eye: No discharge.     Extraocular Movements: Extraocular movements intact.     Conjunctiva/sclera: Conjunctivae normal.     Pupils: Pupils are equal, round, and reactive to light.     Visual Fields: Right eye visual fields normal and left eye visual fields normal.  Neck:     Thyroid: No thyromegaly.     Vascular: No carotid bruit.     Trachea: Trachea normal.  Cardiovascular:     Rate and Rhythm: Normal rate and regular rhythm.     Heart sounds: Normal heart sounds. No murmur heard.    No gallop.  Pulmonary:     Effort: Pulmonary effort is normal. No accessory muscle usage or respiratory distress.     Breath sounds: Normal breath sounds.  Chest:  Breasts:    Right: Normal.     Left: Normal.  Abdominal:     General: Bowel sounds are normal.     Palpations: Abdomen is soft. There is no hepatomegaly or splenomegaly.     Tenderness: There is no abdominal tenderness.  Musculoskeletal:        General: Normal range of motion.     Cervical back: Normal range of motion and neck supple.     Right lower leg: No edema.     Left lower leg: No edema.  Lymphadenopathy:     Head:     Right side of head: No submental, submandibular, tonsillar, preauricular or posterior auricular adenopathy.     Left side of  head: No submental, submandibular, tonsillar, preauricular or posterior auricular adenopathy.     Cervical: No cervical adenopathy.     Upper Body:     Right upper body: No supraclavicular, axillary or pectoral adenopathy.     Left upper body: No supraclavicular, axillary or pectoral adenopathy.  Skin:    General: Skin is warm and dry.     Capillary Refill: Capillary refill takes less than 2 seconds.     Findings: No rash.  Neurological:     Mental Status: She is alert and oriented to person, place, and time.     Gait: Gait is intact.     Deep Tendon Reflexes: Reflexes are normal and symmetric.     Reflex Scores:      Brachioradialis reflexes are 2+ on the right side and 2+ on the left side.      Patellar reflexes are 2+ on the right side and 2+ on the left side. Psychiatric:        Attention and Perception: Attention normal.        Mood and Affect: Mood normal.        Speech: Speech normal.        Behavior: Behavior normal. Behavior is cooperative.        Thought Content: Thought content normal.        Judgment: Judgment normal.    Results for orders placed or performed in visit on 12/30/22  Veritor Flu A/B Waived  Result Value Ref Range   Influenza A Negative Negative   Influenza B Negative Negative      Assessment & Plan:   Problem List Items Addressed This Visit       Cardiovascular and Mediastinum   Hypertension - Primary    Chronic, stable.  BP at goal in office and at home.  Recommend she monitor BP at least a few mornings a week at home and document.  DASH diet at home.  Continue current medication regimen and adjust as needed.  Labs today: CBC, CMP, TSH.  Return in 6 months.         Relevant Medications   benazepril (LOTENSIN) 20 MG tablet   simvastatin (ZOCOR) 40 MG tablet   Other Relevant Orders   Comprehensive metabolic panel   CBC with Differential/Platelet     Respiratory   OSA on CPAP    Chronic, stable.  Followed by neurology, continue 100% use of  CPAP.        Digestive   GERD (gastroesophageal reflux disease)    Chronic, stable.  Continue current medication regimen and adjust as needed.  Mag level annually.  Risks of PPI use were discussed with patient including bone loss, C. Diff diarrhea, pneumonia, infections, CKD, electrolyte abnormalities.  Verbalizes understanding and chooses to continue the medication.       Relevant Orders   Magnesium     Other   Hyperlipidemia    Chronic, ongoing.  Continue current medication regimen.  Lipid panel today and adjust statin as needed.      Relevant Medications   benazepril (LOTENSIN) 20 MG tablet   simvastatin (ZOCOR) 40 MG tablet   Other Relevant Orders   Lipid Panel w/o Chol/HDL Ratio   Comprehensive metabolic panel   Low TSH level    Recheck levels today.  Recent have been stable over past years, if still stable will discontinue this.      Relevant Orders   T4, free   TSH   Obesity    BMI 32.38.  Recommended eating smaller high protein, low fat meals more frequently and exercising 30 mins a day 5 times a week with a goal of 10-15lb weight loss in the next 3 months. Patient voiced their understanding and motivation to adhere to these recommendations.       Prediabetes    Ongoing with last check 6.1%. Has had Prednisone recently.  Continue diet/exercise focus and recheck A1c today, if stable check Q6MOS to annually.  Relevant Orders   HgB A1c   Vitamin D deficiency    Ongoing, continue supplement and adjust dosing as needed.  Vit D level today.      Relevant Orders   VITAMIN D 25 Hydroxy (Vit-D Deficiency, Fractures)   Other Visit Diagnoses     Acute non-recurrent frontal sinusitis       Has had Prednisone x 2 bursts.  Will send in Zpack and to return if ongoing symptoms.   Relevant Medications   azithromycin (ZITHROMAX) 250 MG tablet   Encounter for annual physical exam       Annual physical today with labs and health maintenance reviewed, discussed with  patient.        Follow up plan: Return in about 6 months (around 07/27/2023) for HTN/HLD.   LABORATORY TESTING:  - Pap smear: not applicable -- hysterectomy in 2000  IMMUNIZATIONS:   - Tdap: Tetanus vaccination status reviewed: last tetanus booster within 10 years. - Influenza: Refused -- does not get these annually - Pneumovax: Not applicable - Prevnar: Not applicable - HPV: Not applicable - Zostavax vaccine: Refuses - Covid: has had 2 vaccinations  SCREENING: -Mammogram: Up To Date -- due September 2023, does not want to obtain yet - Colonoscopy: Refuses - due September 2023, does not want to obtain yet - Bone Density: Not applicable obtain at age 14 -Hearing Test: Not applicable  -Spirometry: Not applicable   PATIENT COUNSELING:   Advised to take 1 mg of folate supplement per day if capable of pregnancy.   Sexuality: Discussed sexually transmitted diseases, partner selection, use of condoms, avoidance of unintended pregnancy  and contraceptive alternatives.   Advised to avoid cigarette smoking.  I discussed with the patient that most people either abstain from alcohol or drink within safe limits (<=14/week and <=4 drinks/occasion for males, <=7/weeks and <= 3 drinks/occasion for females) and that the risk for alcohol disorders and other health effects rises proportionally with the number of drinks per week and how often a drinker exceeds daily limits.  Discussed cessation/primary prevention of drug use and availability of treatment for abuse.   Diet: Encouraged to adjust caloric intake to maintain  or achieve ideal body weight, to reduce intake of dietary saturated fat and total fat, to limit sodium intake by avoiding high sodium foods and not adding table salt, and to maintain adequate dietary potassium and calcium preferably from fresh fruits, vegetables, and low-fat dairy products.    Stressed the importance of regular exercise  Injury prevention: Discussed safety  belts, safety helmets, smoke detector, smoking near bedding or upholstery.   Dental health: Discussed importance of regular tooth brushing, flossing, and dental visits.    NEXT PREVENTATIVE PHYSICAL DUE IN 1 YEAR. Return in about 6 months (around 07/27/2023) for HTN/HLD.

## 2023-01-27 NOTE — Assessment & Plan Note (Signed)
Chronic, ongoing.  Continue current medication regimen.  Lipid panel today and adjust statin as needed.

## 2023-01-27 NOTE — Assessment & Plan Note (Signed)
Chronic, stable.  Continue current medication regimen and adjust as needed.  Mag level annually.  Risks of PPI use were discussed with patient including bone loss, C. Diff diarrhea, pneumonia, infections, CKD, electrolyte abnormalities.  Verbalizes understanding and chooses to continue the medication.

## 2023-01-27 NOTE — Assessment & Plan Note (Signed)
Ongoing, continue supplement and adjust dosing as needed.  Vit D level today.

## 2023-01-27 NOTE — Assessment & Plan Note (Signed)
Recheck levels today.  Recent have been stable over past years, if still stable will discontinue this.

## 2023-01-27 NOTE — Assessment & Plan Note (Addendum)
Chronic, stable.  Followed by neurology, continue 100% use of CPAP.

## 2023-01-27 NOTE — Assessment & Plan Note (Signed)
Ongoing with last check 6.1%. Has had Prednisone recently.  Continue diet/exercise focus and recheck A1c today, if stable check Q6MOS to annually.

## 2023-01-28 LAB — CBC WITH DIFFERENTIAL/PLATELET
Basophils Absolute: 0.1 10*3/uL (ref 0.0–0.2)
Basos: 1 %
EOS (ABSOLUTE): 0.3 10*3/uL (ref 0.0–0.4)
Eos: 3 %
Hematocrit: 44 % (ref 34.0–46.6)
Hemoglobin: 14.3 g/dL (ref 11.1–15.9)
Immature Grans (Abs): 0 10*3/uL (ref 0.0–0.1)
Immature Granulocytes: 0 %
Lymphocytes Absolute: 3.6 10*3/uL — ABNORMAL HIGH (ref 0.7–3.1)
Lymphs: 38 %
MCH: 30.6 pg (ref 26.6–33.0)
MCHC: 32.5 g/dL (ref 31.5–35.7)
MCV: 94 fL (ref 79–97)
Monocytes Absolute: 0.8 10*3/uL (ref 0.1–0.9)
Monocytes: 9 %
Neutrophils Absolute: 4.8 10*3/uL (ref 1.4–7.0)
Neutrophils: 49 %
Platelets: 337 10*3/uL (ref 150–450)
RBC: 4.67 x10E6/uL (ref 3.77–5.28)
RDW: 12.3 % (ref 11.7–15.4)
WBC: 9.6 10*3/uL (ref 3.4–10.8)

## 2023-01-28 LAB — LIPID PANEL W/O CHOL/HDL RATIO
Cholesterol, Total: 200 mg/dL — ABNORMAL HIGH (ref 100–199)
HDL: 49 mg/dL (ref >39)
LDL Chol Calc (NIH): 126 mg/dL — ABNORMAL HIGH (ref 0–99)
Triglycerides: 138 mg/dL (ref 0–149)
VLDL Cholesterol Cal: 25 mg/dL (ref 5–40)

## 2023-01-28 LAB — HEMOGLOBIN A1C
Est. average glucose Bld gHb Est-mCnc: 131 mg/dL
Hgb A1c MFr Bld: 6.2 % — ABNORMAL HIGH (ref 4.8–5.6)

## 2023-01-28 LAB — COMPREHENSIVE METABOLIC PANEL
ALT: 38 [IU]/L — ABNORMAL HIGH (ref 0–32)
AST: 34 [IU]/L (ref 0–40)
Albumin: 4.5 g/dL (ref 3.9–4.9)
Alkaline Phosphatase: 98 [IU]/L (ref 44–121)
BUN/Creatinine Ratio: 16 (ref 12–28)
BUN: 11 mg/dL (ref 8–27)
Bilirubin Total: 0.4 mg/dL (ref 0.0–1.2)
CO2: 23 mmol/L (ref 20–29)
Calcium: 9.9 mg/dL (ref 8.7–10.3)
Chloride: 100 mmol/L (ref 96–106)
Creatinine, Ser: 0.68 mg/dL (ref 0.57–1.00)
Globulin, Total: 2.3 g/dL (ref 1.5–4.5)
Glucose: 95 mg/dL (ref 70–99)
Potassium: 4.2 mmol/L (ref 3.5–5.2)
Sodium: 140 mmol/L (ref 134–144)
Total Protein: 6.8 g/dL (ref 6.0–8.5)
eGFR: 98 mL/min/{1.73_m2} (ref >59)

## 2023-01-28 LAB — MAGNESIUM: Magnesium: 2.1 mg/dL (ref 1.6–2.3)

## 2023-01-28 LAB — T4, FREE: Free T4: 1.03 ng/dL (ref 0.82–1.77)

## 2023-01-28 LAB — VITAMIN D 25 HYDROXY (VIT D DEFICIENCY, FRACTURES): Vit D, 25-Hydroxy: 30.4 ng/mL (ref 30.0–100.0)

## 2023-01-28 LAB — TSH: TSH: 0.959 u[IU]/mL (ref 0.450–4.500)

## 2023-01-28 NOTE — Progress Notes (Signed)
Contacted via MyChart  Good evening Carrie Arroyo, your labs have returned: - Lipid panel is showing elevations which is new.  Are you taking Simvastatin regularly? Please let me know because if you are we may need to change statins. - There is very mild elevation in one liver function lab - ALT, but AST is normal. - A1c, diabetes testing,has trended up a little to 6.2%.  Goal is to avoid this getting to 6.5% or greater.  Focus on diet and regular exercise. - Remainder of labs stable.  Any questions? Keep being awesome!!  Thank you for allowing me to participate in your care.  I appreciate you. Kindest regards, Walid Haig

## 2023-01-29 ENCOUNTER — Encounter: Payer: Self-pay | Admitting: Nurse Practitioner

## 2023-02-12 ENCOUNTER — Other Ambulatory Visit: Payer: Self-pay | Admitting: Nurse Practitioner

## 2023-02-12 NOTE — Telephone Encounter (Signed)
Requested Prescriptions  Refused Prescriptions Disp Refills   albuterol (VENTOLIN HFA) 108 (90 Base) MCG/ACT inhaler [Pharmacy Med Name: ALBUTEROL HFA (PROVENTIL) INH] 6.7 each 0    Sig: TAKE 2 PUFFS BY MOUTH EVERY 6 HOURS AS NEEDED FOR WHEEZE OR SHORTNESS OF BREATH     Pulmonology:  Beta Agonists 2 Passed - 02/12/2023 10:26 AM      Passed - Last BP in normal range    BP Readings from Last 1 Encounters:  01/27/23 124/79         Passed - Last Heart Rate in normal range    Pulse Readings from Last 1 Encounters:  01/27/23 90         Passed - Valid encounter within last 12 months    Recent Outpatient Visits           2 weeks ago Primary hypertension   [place] [facility] [place], [place] T, NP   1 month ago Acute URI   [place] [facility] [place], Sherran Needs, NP   3 months ago Urinary symptom or sign   [place] [facility] [place], Nenzel T, NP   6 months ago Primary hypertension   [place] [facility] [place], Corrie Dandy T, NP   1 year ago Primary hypertension   [place] [facility] [place], Dorie Rank, NP       Future Appointments             In 5 months [place], Dorie Rank, NP [place] [facility]

## 2023-02-18 DIAGNOSIS — Z1330 Encounter for screening examination for mental health and behavioral disorders, unspecified: Secondary | ICD-10-CM | POA: Diagnosis not present

## 2023-02-18 DIAGNOSIS — G4733 Obstructive sleep apnea (adult) (pediatric): Secondary | ICD-10-CM | POA: Diagnosis not present

## 2023-02-26 DIAGNOSIS — G4733 Obstructive sleep apnea (adult) (pediatric): Secondary | ICD-10-CM | POA: Diagnosis not present

## 2023-03-29 DIAGNOSIS — G4733 Obstructive sleep apnea (adult) (pediatric): Secondary | ICD-10-CM | POA: Diagnosis not present

## 2023-04-21 DIAGNOSIS — L853 Xerosis cutis: Secondary | ICD-10-CM | POA: Diagnosis not present

## 2023-04-21 DIAGNOSIS — L814 Other melanin hyperpigmentation: Secondary | ICD-10-CM | POA: Diagnosis not present

## 2023-04-21 DIAGNOSIS — L988 Other specified disorders of the skin and subcutaneous tissue: Secondary | ICD-10-CM | POA: Diagnosis not present

## 2023-04-21 DIAGNOSIS — L821 Other seborrheic keratosis: Secondary | ICD-10-CM | POA: Diagnosis not present

## 2023-04-21 DIAGNOSIS — L2089 Other atopic dermatitis: Secondary | ICD-10-CM | POA: Diagnosis not present

## 2023-04-21 DIAGNOSIS — L728 Other follicular cysts of the skin and subcutaneous tissue: Secondary | ICD-10-CM | POA: Diagnosis not present

## 2023-04-21 DIAGNOSIS — L820 Inflamed seborrheic keratosis: Secondary | ICD-10-CM | POA: Diagnosis not present

## 2023-04-21 DIAGNOSIS — L2989 Other pruritus: Secondary | ICD-10-CM | POA: Diagnosis not present

## 2023-04-21 DIAGNOSIS — D485 Neoplasm of uncertain behavior of skin: Secondary | ICD-10-CM | POA: Diagnosis not present

## 2023-05-28 ENCOUNTER — Telehealth: Payer: Self-pay | Admitting: Physician Assistant

## 2023-05-28 DIAGNOSIS — R3989 Other symptoms and signs involving the genitourinary system: Secondary | ICD-10-CM

## 2023-05-28 MED ORDER — NITROFURANTOIN MONOHYD MACRO 100 MG PO CAPS: 100.0000 mg | ORAL_CAPSULE | Freq: Two times a day (BID) | ORAL | 0 refills | Status: DC

## 2023-05-28 NOTE — Progress Notes (Signed)

## 2023-05-28 NOTE — Progress Notes (Signed)
 I have spent 5 minutes in review of e-visit questionnaire, review and updating patient chart, medical decision making and response to patient.   Piedad Climes, PA-C

## 2023-07-26 NOTE — Patient Instructions (Signed)
 Be Involved in Caring For Your Health:  Taking Medications When medications are taken as directed, they can greatly improve your health. But if they are not taken as prescribed, they may not work. In some cases, not taking them correctly can be harmful. To help ensure your treatment remains effective and safe, understand your medications and how to take them. Bring your medications to each visit for review by your provider.  Your lab results, notes, and after visit summary will be available on My Chart. We strongly encourage you to use this feature. If lab results are abnormal the clinic will contact you with the appropriate steps. If the clinic does not contact you assume the results are satisfactory. You can always view your results on My Chart. If you have questions regarding your health or results, please contact the clinic during office hours. You can also ask questions on My Chart.  We at [facility] are grateful that you chose [place] to provide your care. We strive to provide evidence-based and compassionate care and are always looking for feedback. If you get a survey from the clinic please complete this so we can hear your opinions.  DASH Eating Plan DASH stands for Dietary Approaches to Stop Hypertension. The DASH eating plan is a healthy eating plan that has been shown to: Lower high blood pressure (hypertension). Reduce your risk for type 2 diabetes, heart disease, and stroke. Help with weight loss. What are tips for following this plan? Reading food labels Check food labels for the amount of salt (sodium) per serving. Choose foods with less than 5 percent of the Daily Value (DV) of sodium. In general, foods with less than 300 milligrams (mg) of sodium per serving fit into this eating plan. To find whole grains, look for the word "whole" as the first word in the ingredient list. Shopping Buy products labeled as "low-sodium" or "no salt added." Buy fresh foods. Avoid canned  foods and pre-made or frozen meals. Cooking Try not to add salt when you cook. Use salt-free seasonings or herbs instead of table salt or sea salt. Check with your health care provider or pharmacist before using salt substitutes. Do not fry foods. Cook foods in healthy ways, such as baking, boiling, grilling, roasting, or broiling. Cook using oils that are good for your heart. These include olive, canola, avocado, soybean, and sunflower oil. Meal planning  Eat a balanced diet. This should include: 4 or more servings of fruits and 4 or more servings of vegetables each day. Try to fill half of your plate with fruits and vegetables. 6-8 servings of whole grains each day. 6 or less servings of lean meat, poultry, or fish each day. 1 oz is 1 serving. A 3 oz (85 g) serving of meat is about the same size as the palm of your hand. One egg is 1 oz (28 g). 2-3 servings of low-fat dairy each day. One serving is 1 cup (237 mL). 1 serving of nuts, seeds, or beans 5 times each week. 2-3 servings of heart-healthy fats. Healthy fats called omega-3 fatty acids are found in foods such as walnuts, flaxseeds, fortified milks, and eggs. These fats are also found in cold-water fish, such as sardines, salmon, and mackerel. Limit how much you eat of: Canned or prepackaged foods. Food that is high in trans fat, such as fried foods. Food that is high in saturated fat, such as fatty meat. Desserts and other sweets, sugary drinks, and other foods with added sugar. Full-fat  dairy products. Do not salt foods before eating. Do not eat more than 4 egg yolks a week. Try to eat at least 2 vegetarian meals a week. Eat more home-cooked food and less restaurant, buffet, and fast food. Lifestyle When eating at a restaurant, ask if your food can be made with less salt or no salt. If you drink alcohol: Limit how much you have to: 0-1 drink a day if you are female. 0-2 drinks a day if you are female. Know how much alcohol is in  your drink. In the [place] one drink is one 12 oz bottle of beer (355 mL), one 5 oz glass of wine (148 mL), or one 1 oz glass of hard liquor (44 mL). General information Avoid eating more than 2,300 mg of salt a day. If you have hypertension, you may need to reduce your sodium intake to 1,500 mg a day. Work with your provider to stay at a healthy body weight or lose weight. Ask what the best weight range is for you. On most days of the week, get at least 30 minutes of exercise that causes your heart to beat faster. This may include walking, swimming, or biking. Work with your provider or dietitian to adjust your eating plan to meet your specific calorie needs. What foods should I eat? Fruits All fresh, dried, or frozen fruit. Canned fruits that are in their natural juice and do not have sugar added to them. Vegetables Fresh or frozen vegetables that are raw, steamed, roasted, or grilled. Low-sodium or reduced-sodium tomato and vegetable juice. Low-sodium or reduced-sodium tomato sauce and tomato paste. Low-sodium or reduced-sodium canned vegetables. Grains Whole-grain or whole-wheat bread. Whole-grain or whole-wheat pasta. Brown rice. Orpah Cobb. Bulgur. Whole-grain and low-sodium cereals. Pita bread. Low-fat, low-sodium crackers. Whole-wheat flour tortillas. Meats and other proteins Skinless chicken or [place]. Ground chicken or [place]. Pork with fat trimmed off. Fish and seafood. Egg whites. Dried beans, peas, or lentils. Unsalted nuts, nut butters, and seeds. Unsalted canned beans. Lean cuts of beef with fat trimmed off. Low-sodium, lean precooked or cured meat, such as sausages or meat loaves. Dairy Low-fat (1%) or fat-free (skim) milk. Reduced-fat, low-fat, or fat-free cheeses. Nonfat, low-sodium ricotta or cottage cheese. Low-fat or nonfat yogurt. Low-fat, low-sodium cheese. Fats and oils Soft margarine without trans fats. Vegetable oil. Reduced-fat, low-fat, or light mayonnaise and salad  dressings (reduced-sodium). Canola, safflower, olive, avocado, soybean, and sunflower oils. Avocado. Seasonings and condiments Herbs. Spices. Seasoning mixes without salt. Other foods Unsalted popcorn and pretzels. Fat-free sweets. The items listed above may not be all the foods and drinks you can have. Talk to a dietitian to learn more. What foods should I avoid? Fruits Canned fruit in a light or heavy syrup. Fried fruit. Fruit in cream or butter sauce. Vegetables Creamed or fried vegetables. Vegetables in a cheese sauce. Regular canned vegetables that are not marked as low-sodium or reduced-sodium. Regular canned tomato sauce and paste that are not marked as low-sodium or reduced-sodium. Regular tomato and vegetable juices that are not marked as low-sodium or reduced-sodium. Rosita Fire. Olives. Grains Baked goods made with fat, such as croissants, muffins, or some breads. Dry pasta or rice meal packs. Meats and other proteins Fatty cuts of meat. Ribs. Fried meat. Tomasa Blase. Bologna, salami, and other precooked or cured meats, such as sausages or meat loaves, that are not lean and low in sodium. Fat from the back of a pig (fatback). Bratwurst. Salted nuts and seeds. Canned beans with added salt. Canned  or smoked fish. Whole eggs or egg yolks. Chicken or [place] with skin. Dairy Whole or 2% milk, cream, and half-and-half. Whole or full-fat cream cheese. Whole-fat or sweetened yogurt. Full-fat cheese. Nondairy creamers. Whipped toppings. Processed cheese and cheese spreads. Fats and oils Butter. Stick margarine. Lard. Shortening. Ghee. Bacon fat. Tropical oils, such as coconut, palm kernel, or palm oil. Seasonings and condiments Onion salt, garlic salt, seasoned salt, table salt, and sea salt. Worcestershire sauce. Tartar sauce. Barbecue sauce. Teriyaki sauce. Soy sauce, including reduced-sodium soy sauce. Steak sauce. Canned and packaged gravies. Fish sauce. Oyster sauce. Cocktail sauce. Store-bought  horseradish. Ketchup. Mustard. Meat flavorings and tenderizers. Bouillon cubes. Hot sauces. Pre-made or packaged marinades. Pre-made or packaged taco seasonings. Relishes. Regular salad dressings. Other foods Salted popcorn and pretzels. The items listed above may not be all the foods and drinks you should avoid. Talk to a dietitian to learn more. Where to find more information National Heart, Lung, and Blood Institute (NHLBI): BuffaloDryCleaner.gl American Heart Association (AHA): heart.org Academy of Nutrition and Dietetics: eatright.org National Kidney Foundation (NKF): kidney.org This information is not intended to replace advice given to you by your health care provider. Make sure you discuss any questions you have with your health care provider. Document Revised: 03/06/2022 Document Reviewed: 03/06/2022 Elsevier Patient Education  2024 [employer].

## 2023-07-29 ENCOUNTER — Encounter: Payer: Self-pay | Admitting: Nurse Practitioner

## 2023-07-29 ENCOUNTER — Ambulatory Visit: Payer: Self-pay | Admitting: Nurse Practitioner

## 2023-07-29 VITALS — BP 131/74 | HR 91 | Temp 98.0°F | Ht 64.5 in | Wt 191.0 lb

## 2023-07-29 DIAGNOSIS — Z6831 Body mass index (BMI) 31.0-31.9, adult: Secondary | ICD-10-CM

## 2023-07-29 DIAGNOSIS — R7303 Prediabetes: Secondary | ICD-10-CM | POA: Diagnosis not present

## 2023-07-29 DIAGNOSIS — G4733 Obstructive sleep apnea (adult) (pediatric): Secondary | ICD-10-CM

## 2023-07-29 DIAGNOSIS — E7800 Pure hypercholesterolemia, unspecified: Secondary | ICD-10-CM

## 2023-07-29 DIAGNOSIS — I10 Essential (primary) hypertension: Secondary | ICD-10-CM

## 2023-07-29 DIAGNOSIS — E6609 Other obesity due to excess calories: Secondary | ICD-10-CM

## 2023-07-29 DIAGNOSIS — E66811 Obesity, class 1: Secondary | ICD-10-CM

## 2023-07-29 LAB — MICROALBUMIN, URINE WAIVED
Creatinine, Urine Waived: 10 mg/dL (ref 10–300)
Microalb, Ur Waived: 10 mg/L (ref 0–19)
Microalb/Creat Ratio: <30 mg/g (ref <30)

## 2023-07-29 LAB — BAYER DCA HB A1C WAIVED: HB A1C (BAYER DCA - WAIVED): 5.6 % (ref 4.8–5.6)

## 2023-07-29 NOTE — Progress Notes (Signed)
 BP 131/74   Pulse 91   Temp 98 F (36.7 C) (Oral)   Ht 5' 4.5" (1.638 m)   Wt 191 lb (86.6 kg)   LMP  (LMP Unknown)   SpO2 96%   BMI 32.28 kg/m    Subjective:    Patient ID: Carrie Arroyo, female    DOB: [birth year], 63 y.o.   MRN: [id number]  HPI: Carrie Arroyo is a 63 y.o. female  Chief Complaint  Patient presents with   Hypertension   Hyperlipidemia   HYPERTENSION / HYPERLIPIDEMIA Taking Benazepril , Simvastatin , ASA.  Former smoker.  Has CPAP she uses, every night 100%. Last saw neuro for this on 02/18/23. Satisfied with current treatment? yes Duration of hypertension: chronic BP monitoring frequency: not lately BP range:  BP medication side effects: no Duration of hyperlipidemia: chronic Cholesterol medication side effects: no Cholesterol supplements: none Medication compliance: good compliance Aspirin: yes Recent stressors: no Recurrent headaches: no Visual changes: no Palpitations: no Dyspnea: no Chest pain: no Lower extremity edema: end of day on occasion Dizzy/lightheaded: no  The 10-year ASCVD risk score (Arnett DK, et al., 2019) is: 6.1%   Values used to calculate the score:     Age: 4 years     Sex: Female     Is Non-Hispanic African American: No     Diabetic: No     Tobacco smoker: No     Systolic Blood Pressure: 131 mmHg     Is BP treated: Yes     HDL Cholesterol: 49 mg/dL     Total Cholesterol: 200 mg/dL   Impaired Fasting Glucose Has been cutting back on sugar. HbA1C:  Lab Results  Component Value Date   HGBA1C 6.2 (H) 01/27/2023  Duration of elevated blood sugar: years Polydipsia: no Polyuria: no Weight change: no Visual disturbance: no Glucose Monitoring: no    Accucheck frequency: Not Checking    Fasting glucose:     Post prandial:  Diabetic Education: Not Completed Family history of diabetes: no    Relevant past medical, surgical, family and social history reviewed and updated as indicated. Interim medical history since  our last visit reviewed. Allergies and medications reviewed and updated.  Review of Systems  Constitutional:  Negative for activity change, appetite change, diaphoresis, fatigue and fever.  Respiratory:  Negative for cough, chest tightness and shortness of breath.   Cardiovascular:  Negative for chest pain, palpitations and leg swelling.  Gastrointestinal: Negative.   Endocrine: Negative for cold intolerance, heat intolerance, polydipsia, polyphagia and polyuria.  Neurological: Negative.   Psychiatric/Behavioral: Negative.      Per HPI unless specifically indicated above     Objective:    BP 131/74   Pulse 91   Temp 98 F (36.7 C) (Oral)   Ht 5' 4.5" (1.638 m)   Wt 191 lb (86.6 kg)   LMP  (LMP Unknown)   SpO2 96%   BMI 32.28 kg/m   Wt Readings from Last 3 Encounters:  07/29/23 191 lb (86.6 kg)  01/27/23 191 lb 9.6 oz (86.9 kg)  12/30/22 193 lb (87.5 kg)    Physical Exam Vitals and nursing note reviewed.  Constitutional:      General: She is awake. She is not in acute distress.    Appearance: She is well-developed and well-groomed. She is obese. She is not ill-appearing or toxic-appearing.  HENT:     Head: Normocephalic.     Right Ear: Hearing and external ear normal.     Left  Ear: Hearing and external ear normal.  Eyes:     General: Lids are normal.        Right eye: No discharge.        Left eye: No discharge.     Conjunctiva/sclera: Conjunctivae normal.     Pupils: Pupils are equal, round, and reactive to light.  Neck:     Thyroid : No thyromegaly.     Vascular: No carotid bruit.  Cardiovascular:     Rate and Rhythm: Normal rate and regular rhythm.     Heart sounds: Normal heart sounds. No murmur heard.    No gallop.  Pulmonary:     Effort: Pulmonary effort is normal. No accessory muscle usage or respiratory distress.     Breath sounds: Normal breath sounds.  Abdominal:     General: Bowel sounds are normal. There is no distension.     Palpations: Abdomen  is soft.     Tenderness: There is no abdominal tenderness.  Musculoskeletal:     Cervical back: Normal range of motion and neck supple.     Right lower leg: No edema.     Left lower leg: No edema.  Lymphadenopathy:     Cervical: No cervical adenopathy.  Skin:    General: Skin is warm and dry.  Neurological:     Mental Status: She is alert and oriented to person, place, and time.     Deep Tendon Reflexes: Reflexes are normal and symmetric.     Reflex Scores:      Brachioradialis reflexes are 2+ on the right side and 2+ on the left side.      Patellar reflexes are 2+ on the right side and 2+ on the left side. Psychiatric:        Attention and Perception: Attention normal.        Mood and Affect: Mood normal.        Speech: Speech normal.        Behavior: Behavior normal. Behavior is cooperative.        Thought Content: Thought content normal.    Results for orders placed or performed in visit on 01/27/23  T4, free   Collection Time: 01/27/23  8:55 AM  Result Value Ref Range   Free T4 1.03 0.82 - 1.77 ng/dL  TSH   Collection Time: 01/27/23  8:55 AM  Result Value Ref Range   TSH 0.959 0.450 - 4.500 uIU/mL  Lipid Panel w/o Chol/HDL Ratio   Collection Time: 01/27/23  8:55 AM  Result Value Ref Range   Cholesterol, Total 200 (H) 100 - 199 mg/dL   Triglycerides 409 0 - 149 mg/dL   HDL 49 >81 mg/dL   VLDL Cholesterol Cal 25 5 - 40 mg/dL   LDL Chol Calc (NIH) 191 (H) 0 - 99 mg/dL  Comprehensive metabolic panel   Collection Time: 01/27/23  8:55 AM  Result Value Ref Range   Glucose 95 70 - 99 mg/dL   BUN 11 8 - 27 mg/dL   Creatinine, Ser 4.78 0.57 - 1.00 mg/dL   eGFR 98 >29 FA/OZH/0.86   BUN/Creatinine Ratio 16 12 - 28   Sodium 140 134 - 144 mmol/L   Potassium 4.2 3.5 - 5.2 mmol/L   Chloride 100 96 - 106 mmol/L   CO2 23 20 - 29 mmol/L   Calcium 9.9 8.7 - 10.3 mg/dL   Total Protein 6.8 6.0 - 8.5 g/dL   Albumin 4.5 3.9 - 4.9 g/dL   Globulin, Total 2.3 1.5 -  4.5 g/dL    Bilirubin Total 0.4 0.0 - 1.2 mg/dL   Alkaline Phosphatase 98 44 - 121 IU/L   AST 34 0 - 40 IU/L   ALT 38 (H) 0 - 32 IU/L  CBC with Differential/Platelet   Collection Time: 01/27/23  8:55 AM  Result Value Ref Range   WBC 9.6 3.4 - 10.8 x10E3/uL   RBC 4.67 3.77 - 5.28 x10E6/uL   Hemoglobin 14.3 11.1 - 15.9 g/dL   Hematocrit 16.1 09.6 - 46.6 %   MCV 94 79 - 97 fL   MCH 30.6 26.6 - 33.0 pg   MCHC 32.5 31.5 - 35.7 g/dL   RDW 04.5 40.9 - 81.1 %   Platelets 337 150 - 450 x10E3/uL   Neutrophils 49 Not Estab. %   Lymphs 38 Not Estab. %   Monocytes 9 Not Estab. %   Eos 3 Not Estab. %   Basos 1 Not Estab. %   Neutrophils Absolute 4.8 1.4 - 7.0 x10E3/uL   Lymphocytes Absolute 3.6 (H) 0.7 - 3.1 x10E3/uL   Monocytes Absolute 0.8 0.1 - 0.9 x10E3/uL   EOS (ABSOLUTE) 0.3 0.0 - 0.4 x10E3/uL   Basophils Absolute 0.1 0.0 - 0.2 x10E3/uL   Immature Granulocytes 0 Not Estab. %   Immature Grans (Abs) 0.0 0.0 - 0.1 x10E3/uL  VITAMIN D  25 Hydroxy (Vit-D Deficiency, Fractures)   Collection Time: 01/27/23  8:55 AM  Result Value Ref Range   Vit D, 25-Hydroxy 30.4 30.0 - 100.0 ng/mL  Magnesium   Collection Time: 01/27/23  8:55 AM  Result Value Ref Range   Magnesium 2.1 1.6 - 2.3 mg/dL  HgB B1Y   Collection Time: 01/27/23  8:55 AM  Result Value Ref Range   Hgb A1c MFr Bld 6.2 (H) 4.8 - 5.6 %   Est. average glucose Bld gHb Est-mCnc 131 mg/dL      Assessment & Plan:   Problem List Items Addressed This Visit       Cardiovascular and Mediastinum   Hypertension - Primary   Chronic, stable.  BP at goal in office and at home.  Recommend she monitor BP at least a few mornings a week at home and document.  DASH diet at home.  Continue current medication regimen and adjust as needed.  Labs today: CMP.  Urine ALB 11 Jul 2023.  Return in 6 months.         Relevant Orders   Microalbumin, Urine Waived   Comprehensive metabolic panel with GFR     Respiratory   OSA on CPAP   Chronic, stable.  Followed  by neurology, continue 100% use of CPAP.        Other   Prediabetes   Ongoing with A1c trending down from 6.2% to 5.6% today  Continue diet/exercise focus and recheck A1c at physical, if stable check Q6MOS to annually.  Has been focused on lowering sugar which is offering benefit.  Praised for this.      Relevant Orders   Bayer DCA Hb A1c Waived   Microalbumin, Urine Waived   Comprehensive metabolic panel with GFR   Obesity   BMI 32.38, has been focused on lowering sugar intake.  Recommended eating smaller high protein, low fat meals more frequently and exercising 30 mins a day 5 times a week with a goal of 10-15lb weight loss in the next 3 months. Patient voiced their understanding and motivation to adhere to these recommendations.       Hyperlipidemia   Chronic, ongoing.  Continue current medication regimen.  Lipid panel today and adjust statin as needed.      Relevant Orders   Comprehensive metabolic panel with GFR   Lipid Panel w/o Chol/HDL Ratio     Follow up plan: Return in about 6 months (around 01/29/2024) for Annual Physical -- after 01/27/24.

## 2023-07-29 NOTE — Assessment & Plan Note (Addendum)
 Chronic, stable.  BP at goal in office and at home.  Recommend she monitor BP at least a few mornings a week at home and document.  DASH diet at home.  Continue current medication regimen and adjust as needed.  Labs today: CMP.  Urine ALB 11 Jul 2023.  Return in 6 months.

## 2023-07-29 NOTE — Assessment & Plan Note (Signed)
 Chronic, ongoing.  Continue current medication regimen.  Lipid panel today and adjust statin as needed.

## 2023-07-29 NOTE — Assessment & Plan Note (Signed)
 Chronic, stable.  Followed by neurology, continue 100% use of CPAP.

## 2023-07-29 NOTE — Assessment & Plan Note (Signed)
 Ongoing with A1c trending down from 6.2% to 5.6% today  Continue diet/exercise focus and recheck A1c at physical, if stable check Q6MOS to annually.  Has been focused on lowering sugar which is offering benefit.  Praised for this.

## 2023-07-29 NOTE — Assessment & Plan Note (Signed)
 BMI 32.38, has been focused on lowering sugar intake.  Recommended eating smaller high protein, low fat meals more frequently and exercising 30 mins a day 5 times a week with a goal of 10-15lb weight loss in the next 3 months. Patient voiced their understanding and motivation to adhere to these recommendations.

## 2023-07-30 ENCOUNTER — Ambulatory Visit: Payer: Self-pay | Admitting: Nurse Practitioner

## 2023-07-30 LAB — COMPREHENSIVE METABOLIC PANEL WITH GFR
ALT: 45 IU/L — ABNORMAL HIGH (ref 0–32)
AST: 41 IU/L — ABNORMAL HIGH (ref 0–40)
Albumin: 4.6 g/dL (ref 3.9–4.9)
Alkaline Phosphatase: 110 IU/L (ref 44–121)
BUN/Creatinine Ratio: 10 — ABNORMAL LOW (ref 12–28)
BUN: 7 mg/dL — ABNORMAL LOW (ref 8–27)
Bilirubin Total: 0.3 mg/dL (ref 0.0–1.2)
CO2: 22 mmol/L (ref 20–29)
Calcium: 9.6 mg/dL (ref 8.7–10.3)
Chloride: 99 mmol/L (ref 96–106)
Creatinine, Ser: 0.67 mg/dL (ref 0.57–1.00)
Globulin, Total: 2.3 g/dL (ref 1.5–4.5)
Glucose: 97 mg/dL (ref 70–99)
Potassium: 4 mmol/L (ref 3.5–5.2)
Sodium: 139 mmol/L (ref 134–144)
Total Protein: 6.9 g/dL (ref 6.0–8.5)
eGFR: 99 mL/min/{1.73_m2} (ref >59)

## 2023-07-30 LAB — LIPID PANEL W/O CHOL/HDL RATIO
Cholesterol, Total: 153 mg/dL (ref 100–199)
HDL: 43 mg/dL (ref >39)
LDL Chol Calc (NIH): 91 mg/dL (ref 0–99)
Triglycerides: 105 mg/dL (ref 0–149)
VLDL Cholesterol Cal: 19 mg/dL (ref 5–40)

## 2023-07-30 NOTE — Progress Notes (Signed)
 Contacted via MyChart   Good morning Carrie Arroyo, your labs have returned: - Kidney function, creatinine and eGFR, remains normal. Liver function continues to show mild elevations.  We will continue to monitor and may obtain imaging in future to check on liver if any worsening. - Lipid panel looks better this check.  Continue Simvastatin . Any questions? Keep being stellar!!  Thank you for allowing me to participate in your care.  I appreciate you. Kindest regards, Florentine Diekman

## 2023-08-03 DIAGNOSIS — G4733 Obstructive sleep apnea (adult) (pediatric): Secondary | ICD-10-CM | POA: Diagnosis not present

## 2023-09-02 DIAGNOSIS — G4733 Obstructive sleep apnea (adult) (pediatric): Secondary | ICD-10-CM | POA: Diagnosis not present

## 2023-10-03 DIAGNOSIS — G4733 Obstructive sleep apnea (adult) (pediatric): Secondary | ICD-10-CM | POA: Diagnosis not present

## 2023-11-30 DIAGNOSIS — R92323 Mammographic fibroglandular density, bilateral breasts: Secondary | ICD-10-CM | POA: Diagnosis not present

## 2023-11-30 DIAGNOSIS — Z1231 Encounter for screening mammogram for malignant neoplasm of breast: Secondary | ICD-10-CM | POA: Diagnosis not present

## 2024-01-15 ENCOUNTER — Ambulatory Visit: Admitting: Nurse Practitioner

## 2024-01-15 VITALS — BP 128/82 | HR 77 | Temp 97.8°F | Resp 15 | Ht 64.49 in | Wt 185.4 lb

## 2024-01-15 DIAGNOSIS — J0110 Acute frontal sinusitis, unspecified: Secondary | ICD-10-CM

## 2024-01-15 DIAGNOSIS — J321 Chronic frontal sinusitis: Secondary | ICD-10-CM | POA: Insufficient documentation

## 2024-01-15 MED ORDER — ALBUTEROL SULFATE HFA 108 (90 BASE) MCG/ACT IN AERS: 2.0000 | INHALATION_SPRAY | Freq: Four times a day (QID) | RESPIRATORY_TRACT | 1 refills | Status: AC | PRN

## 2024-01-15 MED ORDER — HYDROCOD POLI-CHLORPHE POLI ER 10-8 MG/5ML PO SUER: 5.0000 mL | Freq: Every evening | ORAL | 0 refills | Status: DC | PRN

## 2024-01-15 MED ORDER — AZITHROMYCIN 250 MG PO TABS: ORAL_TABLET | ORAL | 0 refills | Status: AC

## 2024-01-15 MED ORDER — PREDNISONE 20 MG PO TABS: 40.0000 mg | ORAL_TABLET | Freq: Every day | ORAL | 0 refills | Status: AC

## 2024-01-15 NOTE — Progress Notes (Signed)
 BP 128/82 (BP Location: Left Arm, Patient Position: Sitting, Cuff Size: Normal)   Pulse 77   Temp 97.8 F (36.6 C) (Oral)   Resp 15   Ht 5' 4.49 (1.638 m)   Wt 185 lb 6.4 oz (84.1 kg)   LMP  (LMP Unknown)   SpO2 93%   BMI 31.34 kg/m    Subjective:    Patient ID: Carrie Arroyo, female    DOB: 1960-12-22, 63 y.o.   MRN: [id number]  HPI: Carrie Arroyo is a 63 y.o. female  Chief Complaint  Patient presents with   Sinus Problem    Ongoing about a week. Feels like sinus and chest congestion. Neg home tests for covid, no flu testing. Neg for any fevers. Teeth and nose hurt.    UPPER RESPIRATORY TRACT INFECTION Has been having sinus congestion for over one week.  Negative Covid and Flu at home. Was recently traveling and tends to get this yearly. Tends to do well with Zpack and inhaler. Fever: no Cough: yes Shortness of breath: yes with coughing Wheezing: yes with coughing Chest pain: no Chest tightness: yes a little bit Chest congestion: yes a little bit Nasal congestion: yes Runny nose: yes Post nasal drip: yes Sneezing: no Sore throat: no Swollen glands: no Sinus pressure: yes Headache: yes Face pain: no Toothache: yes Ear pain: no none Ear pressure: right ear is popping a lot Eyes red/itching:no Eye drainage/crusting: no  Vomiting: a little nausea Rash: no Fatigue: yes Sick contacts: probably with traveling Strep contacts: no  Context: fluctuating Recurrent sinusitis: no Relief with OTC cold/cough medications: no  Treatments attempted: Alka Arroyo Plus Day and Night + Delsym    Relevant past medical, surgical, family and social history reviewed and updated as indicated. Interim medical history since our last visit reviewed. Allergies and medications reviewed and updated.  Review of Systems  Constitutional:  Positive for fatigue. Negative for activity change, appetite change, chills and fever.  HENT:  Positive for congestion, postnasal drip, rhinorrhea,  sinus pressure and sinus pain. Negative for ear discharge, ear pain, facial swelling, sneezing, sore throat and voice change.   Eyes:  Negative for pain and visual disturbance.  Respiratory:  Positive for cough, chest tightness, shortness of breath and wheezing.   Cardiovascular:  Negative for chest pain, palpitations and leg swelling.  Gastrointestinal:  Positive for nausea.  Neurological:  Positive for headaches. Negative for dizziness and numbness.  Psychiatric/Behavioral: Negative.      Per HPI unless specifically indicated above     Objective:    BP 128/82 (BP Location: Left Arm, Patient Position: Sitting, Cuff Size: Normal)   Pulse 77   Temp 97.8 F (36.6 C) (Oral)   Resp 15   Ht 5' 4.49 (1.638 m)   Wt 185 lb 6.4 oz (84.1 kg)   LMP  (LMP Unknown)   SpO2 93%   BMI 31.34 kg/m   Wt Readings from Last 3 Encounters:  01/15/24 185 lb 6.4 oz (84.1 kg)  07/29/23 191 lb (86.6 kg)  01/27/23 191 lb 9.6 oz (86.9 kg)    Physical Exam Vitals and nursing note reviewed.  Constitutional:      General: She is awake. She is not in acute distress.    Appearance: She is well-developed and well-groomed. She is obese. She is ill-appearing. She is not toxic-appearing.  HENT:     Head: Normocephalic.     Right Ear: Hearing, ear canal and external ear normal. A middle ear effusion  is present. There is no impacted cerumen. Tympanic membrane is injected. Tympanic membrane is not perforated.     Left Ear: Hearing, ear canal and external ear normal. A middle ear effusion is present. There is no impacted cerumen. Tympanic membrane is not injected or perforated.     Nose: Rhinorrhea present. Rhinorrhea is clear.     Right Sinus: Frontal sinus tenderness present. No maxillary sinus tenderness.     Left Sinus: Frontal sinus tenderness present. No maxillary sinus tenderness.     Mouth/Throat:     Mouth: Mucous membranes are moist.     Pharynx: Posterior oropharyngeal erythema (mild) and postnasal  drip present. No pharyngeal swelling or oropharyngeal exudate.  Eyes:     General: Lids are normal.        Right eye: No discharge.        Left eye: No discharge.     Conjunctiva/sclera: Conjunctivae normal.     Pupils: Pupils are equal, round, and reactive to light.  Neck:     Thyroid : No thyromegaly.     Vascular: No carotid bruit.  Cardiovascular:     Rate and Rhythm: Normal rate and regular rhythm.     Heart sounds: Normal heart sounds. No murmur heard.    No gallop.  Pulmonary:     Effort: Pulmonary effort is normal. No accessory muscle usage or respiratory distress.     Breath sounds: Wheezing present. No decreased breath sounds or rales.     Comments: Scattered expiratory wheezes noted throughout. Abdominal:     General: Bowel sounds are normal.     Palpations: Abdomen is soft. There is no hepatomegaly or splenomegaly.  Musculoskeletal:     Cervical back: Normal range of motion and neck supple.     Right lower leg: No edema.     Left lower leg: No edema.  Lymphadenopathy:     Head:     Right side of head: Posterior auricular adenopathy present. No submental, submandibular, tonsillar or preauricular adenopathy.     Left side of head: No submental, submandibular, tonsillar, preauricular or posterior auricular adenopathy.     Cervical: No cervical adenopathy.  Skin:    General: Skin is warm and dry.  Neurological:     Mental Status: She is alert and oriented to person, place, and time.  Psychiatric:        Attention and Perception: Attention normal.        Mood and Affect: Mood normal.        Speech: Speech normal.        Behavior: Behavior normal. Behavior is cooperative.        Thought Content: Thought content normal.     Results for orders placed or performed in visit on 07/29/23  Bayer DCA Hb A1c Waived   Collection Time: 07/29/23  8:35 AM  Result Value Ref Range   HB A1C (BAYER DCA - WAIVED) 5.6 4.8 - 5.6 %  Microalbumin, Urine Waived   Collection Time:  07/29/23  8:35 AM  Result Value Ref Range   Microalb, Ur Waived 10 0 - 19 mg/L   Creatinine, Urine Waived 10 10 - 300 mg/dL   Microalb/Creat Ratio <30 <30 mg/g  Comprehensive metabolic panel with GFR   Collection Time: 07/29/23  8:35 AM  Result Value Ref Range   Glucose 97 70 - 99 mg/dL   BUN 7 (L) 8 - 27 mg/dL   Creatinine, Ser 9.32 0.57 - 1.00 mg/dL   eGFR 99 >40  mL/min/1.73   BUN/Creatinine Ratio 10 (L) 12 - 28   Sodium 139 134 - 144 mmol/L   Potassium 4.0 3.5 - 5.2 mmol/L   Chloride 99 96 - 106 mmol/L   CO2 22 20 - 29 mmol/L   Calcium 9.6 8.7 - 10.3 mg/dL   Total Protein 6.9 6.0 - 8.5 g/dL   Albumin 4.6 3.9 - 4.9 g/dL   Globulin, Total 2.3 1.5 - 4.5 g/dL   Bilirubin Total 0.3 0.0 - 1.2 mg/dL   Alkaline Phosphatase 110 44 - 121 IU/L   AST 41 (H) 0 - 40 IU/L   ALT 45 (H) 0 - 32 IU/L  Lipid Panel w/o Chol/HDL Ratio   Collection Time: 07/29/23  8:35 AM  Result Value Ref Range   Cholesterol, Total 153 100 - 199 mg/dL   Triglycerides 894 0 - 149 mg/dL   HDL 43 >60 mg/dL   VLDL Cholesterol Cal 19 5 - 40 mg/dL   LDL Chol Calc (NIH) 91 0 - 99 mg/dL      Assessment & Plan:   Problem List Items Addressed This Visit       Respiratory   Frontal sinusitis - Primary   Acute and ongoing for a little over one week. Similar to past yearly infections. Will start Zpack and Prednisone  40 MG daily for 5 days. Tussionex and Albuterol  to use as needed.  Recommend: - Increased rest - Increasing Fluids - Acetaminophen  / ibuprofen as needed for fever/pain.  - Salt water gargling, chloraseptic spray and throat lozenges - OTC Coricidin - Mucinex.  - Saline sinus flushes or a neti pot.  - Humidifying the air.       Relevant Medications   azithromycin  (ZITHROMAX ) 250 MG tablet   chlorpheniramine-HYDROcodone  (TUSSIONEX) 10-8 MG/5ML   predniSONE  (DELTASONE ) 20 MG tablet     Follow up plan: Return if symptoms worsen or fail to improve.

## 2024-01-15 NOTE — Patient Instructions (Signed)

## 2024-01-15 NOTE — Assessment & Plan Note (Signed)
 Acute and ongoing for a little over one week. Similar to past yearly infections. Will start Zpack and Prednisone  40 MG daily for 5 days. Tussionex and Albuterol  to use as needed.  Recommend: - Increased rest - Increasing Fluids - Acetaminophen  / ibuprofen as needed for fever/pain.  - Salt water gargling, chloraseptic spray and throat lozenges - OTC Coricidin - Mucinex.  - Saline sinus flushes or a neti pot.  - Humidifying the air.

## 2024-01-21 DIAGNOSIS — G4733 Obstructive sleep apnea (adult) (pediatric): Secondary | ICD-10-CM | POA: Diagnosis not present

## 2024-01-29 NOTE — Patient Instructions (Signed)
 Be Involved in Caring For Your Health:  Taking Medications When medications are taken as directed, they can greatly improve your health. But if they are not taken as prescribed, they may not work. In some cases, not taking them correctly can be harmful. To help ensure your treatment remains effective and safe, understand your medications and how to take them. Bring your medications to each visit for review by your provider.  Your lab results, notes, and after visit summary will be available on My Chart. We strongly encourage you to use this feature. If lab results are abnormal the clinic will contact you with the appropriate steps. If the clinic does not contact you assume the results are satisfactory. You can always view your results on My Chart. If you have questions regarding your health or results, please contact the clinic during office hours. You can also ask questions on My Chart.  We at [facility] are grateful that you chose [place] to provide your care. We strive to provide evidence-based and compassionate care and are always looking for feedback. If you get a survey from the clinic please complete this so we can hear your opinions.  Heart-Healthy Eating Plan Many factors influence your heart health, including eating and exercise habits. Heart health is also called coronary health. Coronary risk increases with abnormal blood fat (lipid) levels. A heart-healthy eating plan includes limiting unhealthy fats, increasing healthy fats, limiting salt (sodium) intake, and making other diet and lifestyle changes. What is my plan? Your health care provider may recommend that: You limit your fat intake to _________% or less of your total calories each day. You limit your saturated fat intake to _________% or less of your total calories each day. You limit the amount of cholesterol in your diet to less than _________ mg per day. You limit the amount of sodium in your diet to less than _________  mg per day. What are tips for following this plan? Cooking Cook foods using methods other than frying. Baking, boiling, grilling, and broiling are all good options. Other ways to reduce fat include: Removing the skin from poultry. Removing all visible fats from meats. Steaming vegetables in water or broth. Meal planning  At meals, imagine dividing your plate into fourths: Fill one-half of your plate with vegetables and green salads. Fill one-fourth of your plate with whole grains. Fill one-fourth of your plate with lean protein foods. Eat 2-4 cups of vegetables per day. One cup of vegetables equals 1 cup (91 g) broccoli or cauliflower florets, 2 medium carrots, 1 large bell pepper, 1 large sweet potato, 1 large tomato, 1 medium white potato, 2 cups (150 g) raw leafy greens. Eat 1-2 cups of fruit per day. One cup of fruit equals 1 small apple, 1 large banana, 1 cup (237 g) mixed fruit, 1 large orange,  cup (82 g) dried fruit, 1 cup (240 mL) 100% fruit juice. Eat more foods that contain soluble fiber. Examples include apples, broccoli, carrots, beans, peas, and barley. Aim to get 25-30 g of fiber per day. Increase your consumption of legumes, nuts, and seeds to 4-5 servings per week. One serving of dried beans or legumes equals  cup (90 g) cooked, 1 serving of nuts is  oz (12 almonds, 24 pistachios, or 7 walnut halves), and 1 serving of seeds equals  oz (8 g). Fats Choose healthy fats more often. Choose monounsaturated and polyunsaturated fats, such as olive and canola oils, avocado oil, flaxseeds, walnuts, almonds, and seeds. Eat  more omega-3 fats. Choose salmon, mackerel, sardines, tuna, flaxseed oil, and ground flaxseeds. Aim to eat fish at least 2 times each week. Check food labels carefully to identify foods with trans fats or high amounts of saturated fat. Limit saturated fats. These are found in animal products, such as meats, butter, and cream. Plant sources of saturated fats  include palm oil, palm kernel oil, and coconut oil. Avoid foods with partially hydrogenated oils in them. These contain trans fats. Examples are stick margarine, some tub margarines, cookies, crackers, and other baked goods. Avoid fried foods. General information Eat more home-cooked food and less restaurant, buffet, and fast food. Limit or avoid alcohol. Limit foods that are high in added sugar and simple starches such as foods made using white refined flour (white breads, pastries, sweets). Lose weight if you are overweight. Losing just 5-10% of your body weight can help your overall health and prevent diseases such as diabetes and heart disease. Monitor your sodium intake, especially if you have high blood pressure. Talk with your health care provider about your sodium intake. Try to incorporate more vegetarian meals weekly. What foods should I eat? Fruits All fresh, canned (in natural juice), or frozen fruits. Vegetables Fresh or frozen vegetables (raw, steamed, roasted, or grilled). Green salads. Grains Most grains. Choose whole wheat and whole grains most of the time. Rice and pasta, including brown rice and pastas made with whole wheat. Meats and other proteins Lean, well-trimmed beef, veal, pork, and lamb. Chicken and [place] without skin. All fish and shellfish. Wild duck, rabbit, pheasant, and venison. Egg whites or low-cholesterol egg substitutes. Dried beans, peas, lentils, and tofu. Seeds and most nuts. Dairy Low-fat or nonfat cheeses, including ricotta and mozzarella. Skim or 1% milk (liquid, powdered, or evaporated). Buttermilk made with low-fat milk. Nonfat or low-fat yogurt. Fats and oils Non-hydrogenated (trans-free) margarines. Vegetable oils, including soybean, sesame, sunflower, olive, avocado, peanut, safflower, corn, canola, and cottonseed. Salad dressings or mayonnaise made with a vegetable oil. Beverages Water (mineral or sparkling). Coffee and tea. Unsweetened ice  tea. Diet beverages. Sweets and desserts Sherbet, gelatin, and fruit ice. Small amounts of dark chocolate. Limit all sweets and desserts. Seasonings and condiments All seasonings and condiments. The items listed above may not be a complete list of foods and beverages you can eat. Contact a dietitian for more options. What foods should I avoid? Fruits Canned fruit in heavy syrup. Fruit in cream or butter sauce. Fried fruit. Limit coconut. Vegetables Vegetables cooked in cheese, cream, or butter sauce. Fried vegetables. Grains Breads made with saturated or trans fats, oils, or whole milk. Croissants. Sweet rolls. Donuts. High-fat crackers, such as cheese crackers and chips. Meats and other proteins Fatty meats, such as hot dogs, ribs, sausage, bacon, rib-eye roast or steak. High-fat deli meats, such as salami and bologna. Caviar. Domestic duck and goose. Organ meats, such as liver. Dairy Cream, sour cream, cream cheese, and creamed cottage cheese. Whole-milk cheeses. Whole or 2% milk (liquid, evaporated, or condensed). Whole buttermilk. Cream sauce or high-fat cheese sauce. Whole-milk yogurt. Fats and oils Meat fat, or shortening. Cocoa butter, hydrogenated oils, palm oil, coconut oil, palm kernel oil. Solid fats and shortenings, including bacon fat, salt pork, lard, and butter. Nondairy cream substitutes. Salad dressings with cheese or sour cream. Beverages Regular sodas and any drinks with added sugar. Sweets and desserts Frosting. Pudding. Cookies. Cakes. Pies. Milk chocolate or white chocolate. Buttered syrups. Full-fat ice cream or ice cream drinks. The items listed above may  not be a complete list of foods and beverages to avoid. Contact a dietitian for more information. Summary Heart-healthy meal planning includes limiting unhealthy fats, increasing healthy fats, limiting salt (sodium) intake and making other diet and lifestyle changes. Lose weight if you are overweight. Losing just  5-10% of your body weight can help your overall health and prevent diseases such as diabetes and heart disease. Focus on eating a balance of foods, including fruits and vegetables, low-fat or nonfat dairy, lean protein, nuts and legumes, whole grains, and heart-healthy oils and fats. This information is not intended to replace advice given to you by your health care provider. Make sure you discuss any questions you have with your health care provider. Document Revised: 03/25/2021 Document Reviewed: 03/25/2021 Elsevier Patient Education  2024 [employer].

## 2024-02-01 DIAGNOSIS — H6983 Other specified disorders of Eustachian tube, bilateral: Secondary | ICD-10-CM | POA: Diagnosis not present

## 2024-02-01 DIAGNOSIS — D110 Benign neoplasm of parotid gland: Secondary | ICD-10-CM | POA: Diagnosis not present

## 2024-02-01 DIAGNOSIS — J301 Allergic rhinitis due to pollen: Secondary | ICD-10-CM | POA: Diagnosis not present

## 2024-02-03 ENCOUNTER — Ambulatory Visit: Admitting: Nurse Practitioner

## 2024-02-03 ENCOUNTER — Encounter: Payer: Self-pay | Admitting: Nurse Practitioner

## 2024-02-03 VITALS — BP 134/84 | HR 80 | Temp 97.9°F | Resp 17 | Ht 64.49 in | Wt 185.2 lb

## 2024-02-03 DIAGNOSIS — Z6831 Body mass index (BMI) 31.0-31.9, adult: Secondary | ICD-10-CM

## 2024-02-03 DIAGNOSIS — K219 Gastro-esophageal reflux disease without esophagitis: Secondary | ICD-10-CM

## 2024-02-03 DIAGNOSIS — E7800 Pure hypercholesterolemia, unspecified: Secondary | ICD-10-CM

## 2024-02-03 DIAGNOSIS — Z1211 Encounter for screening for malignant neoplasm of colon: Secondary | ICD-10-CM

## 2024-02-03 DIAGNOSIS — Z0000 Encounter for general adult medical examination without abnormal findings: Secondary | ICD-10-CM | POA: Diagnosis not present

## 2024-02-03 DIAGNOSIS — I10 Essential (primary) hypertension: Secondary | ICD-10-CM | POA: Diagnosis not present

## 2024-02-03 DIAGNOSIS — E559 Vitamin D deficiency, unspecified: Secondary | ICD-10-CM | POA: Diagnosis not present

## 2024-02-03 DIAGNOSIS — E348 Other specified endocrine disorders: Secondary | ICD-10-CM

## 2024-02-03 DIAGNOSIS — E66811 Obesity, class 1: Secondary | ICD-10-CM

## 2024-02-03 DIAGNOSIS — E6609 Other obesity due to excess calories: Secondary | ICD-10-CM

## 2024-02-03 DIAGNOSIS — G4733 Obstructive sleep apnea (adult) (pediatric): Secondary | ICD-10-CM | POA: Diagnosis not present

## 2024-02-03 DIAGNOSIS — R7303 Prediabetes: Secondary | ICD-10-CM

## 2024-02-03 LAB — URINALYSIS, ROUTINE W REFLEX MICROSCOPIC
Bilirubin, UA: NEGATIVE
Glucose, UA: NEGATIVE
Ketones, UA: NEGATIVE
Leukocytes,UA: NEGATIVE
Nitrite, UA: NEGATIVE
Protein,UA: NEGATIVE
Specific Gravity, UA: 1.01 (ref 1.005–1.030)
Urobilinogen, Ur: 0.2 mg/dL (ref 0.2–1.0)
pH, UA: 7 (ref 5.0–7.5)

## 2024-02-03 LAB — BAYER DCA HB A1C WAIVED: HB A1C (BAYER DCA - WAIVED): 5.7 % — ABNORMAL HIGH (ref 4.8–5.6)

## 2024-02-03 LAB — MICROSCOPIC EXAMINATION
Bacteria, UA: NONE SEEN
WBC, UA: NONE SEEN /HPF (ref 0–5)

## 2024-02-03 MED ORDER — SIMVASTATIN 40 MG PO TABS: ORAL_TABLET | ORAL | 4 refills | Status: AC

## 2024-02-03 MED ORDER — BENAZEPRIL HCL 20 MG PO TABS: 20.0000 mg | ORAL_TABLET | Freq: Every day | ORAL | 4 refills | Status: AC

## 2024-02-03 NOTE — Assessment & Plan Note (Signed)
 Chronic, stable.  Followed by neurology, continue 100% use of CPAP.

## 2024-02-03 NOTE — Assessment & Plan Note (Signed)
 Ongoing, continue supplement and adjust dosing as needed.  Vit D level today.

## 2024-02-03 NOTE — Assessment & Plan Note (Signed)
 Ongoing with A1c 5.6% last check - recheck today.  Continue diet/exercise focus and recheck A1c annually if remains stable.  Has been focused on lowering sugar which is offering benefit.  Praised for this.

## 2024-02-03 NOTE — Assessment & Plan Note (Signed)
 Chronic, ongoing.  Continue current medication regimen.  Lipid panel today and adjust statin as needed.

## 2024-02-03 NOTE — Assessment & Plan Note (Signed)
 Chronic, stable.  BP at goal in office and at home.  Recommend she monitor BP at least a few mornings a week at home and document.  DASH diet at home.  Continue current medication regimen and adjust as needed.  Labs today: CM, CBC, TSH, UA (per patient request).  Urine ALB 11 Jul 2023.  Return in 6 months.

## 2024-02-03 NOTE — Assessment & Plan Note (Signed)
BMI 31.31. Recommended eating smaller high protein, low fat meals more frequently and exercising 30 mins a day 5 times a week with a goal of 10-15lb weight loss in the next 3 months. Patient voiced their understanding and motivation to adhere to these recommendations. ° °

## 2024-02-03 NOTE — Assessment & Plan Note (Signed)
 Chronic, stable.  Continue current medication regimen and adjust as needed.  Mag level annually.  Risks of PPI use were discussed with patient including bone loss, C. Diff diarrhea, pneumonia, infections, CKD, electrolyte abnormalities.  Verbalizes understanding and chooses to continue the medication.

## 2024-02-03 NOTE — Progress Notes (Signed)
 BP 134/84 (BP Location: Left Arm, Patient Position: Sitting, Cuff Size: Large)   Pulse 80   Temp 97.9 F (36.6 C) (Oral)   Resp 17   Ht 5' 4.49 (1.638 m)   Wt 185 lb 3.2 oz (84 kg)   LMP  (LMP Unknown)   SpO2 99%   BMI 31.31 kg/m    Subjective:    Patient ID: Carrie Arroyo, female    DOB: Oct 27, 1960, 63 y.o.   MRN: [id number]  HPI: Carrie Arroyo is a 63 y.o. female presenting on 02/03/2024 for comprehensive medical examination. Current medical complaints include:none  She currently lives with: husband Menopausal Symptoms: no  HYPERTENSION / HYPERLIPIDEMIA Taking Benazepril , Simvastatin , ASA.  Last saw neurology on 02/18/23 for OSA and reports using her CPAP nightly. Non smoker (former smoker -- in college years). History of Werner's Syndrome.  Continues Vitamin D  supplement for history of low levels. Satisfied with current treatment? yes Duration of hypertension: chronic BP monitoring frequency: once a week BP range: 120-130/70 range at home BP medication side effects: no Duration of hyperlipidemia: chronic Cholesterol medication side effects: no Cholesterol supplements: none Medication compliance: good compliance Aspirin: yes Recent stressors: no Recurrent headaches: no Visual changes: no Palpitations: no Dyspnea: no Chest pain: no Lower extremity edema: no Dizzy/lightheaded: no  The 10-year ASCVD risk score (Arnett DK, et al., 2019) is: 6.4%   Values used to calculate the score:     Age: 78 years     Clincally relevant sex: Female     Is Non-Hispanic African American: No     Diabetic: No     Tobacco smoker: No     Systolic Blood Pressure: 134 mmHg     Is BP treated: Yes     HDL Cholesterol: 43 mg/dL     Total Cholesterol: 153 mg/dL    Impaired Fasting Glucose HbA1C:  Lab Results  Component Value Date   HGBA1C 5.6 07/29/2023  Duration of elevated blood sugar: chronic Polydipsia: no Polyuria: no Weight change: no Visual disturbance: no Glucose  Monitoring: no    Accucheck frequency: Not Checking    Fasting glucose:     Post prandial:  Diabetic Education: Not Completed Family history of diabetes: no    GERD Taking Prilosec, generic. Has tried reductions multiple times with no success. GERD control status: stable  Satisfied with current treatment? yes Heartburn frequency: none Medication side effects: no  Medication compliance: stable Previous GERD medications: TUMS Antacid use frequency:  none  Dysphagia: no Odynophagia:  no Hematemesis: no Blood in stool: no EGD: yes   Depression Screen done today and results listed below:     02/03/2024    8:51 AM 01/15/2024   11:26 AM 01/27/2023    8:51 AM 12/30/2022    2:18 PM 10/28/2022    8:28 AM  Depression screen PHQ 2/9  Decreased Interest 0 0 0 0 0  Down, Depressed, Hopeless 0 0 0 0 0  PHQ - 2 Score 0 0 0 0 0  Altered sleeping 0 0 0 0   Tired, decreased energy 0 0 0 0   Change in appetite 0 0 0 0   Feeling bad or failure about yourself  0 0 0 0   Trouble concentrating 0 0 0 0   Moving slowly or fidgety/restless 0 0 0 0   Suicidal thoughts 0 0 0 0   PHQ-9 Score 0 0 0  0    Difficult doing work/chores   Not difficult  at all Not difficult at all      Data saved with a previous flowsheet row definition      02/03/2024    8:51 AM 01/15/2024   11:26 AM 01/27/2023    8:51 AM 12/30/2022    2:18 PM  GAD 7 : Generalized Anxiety Score  Nervous, Anxious, on Edge 0 0 0 0  Control/stop worrying 0 0 0 0  Worry too much - different things 0 0 0 0  Trouble relaxing 0 0 0 0  Restless 0 0 0 0  Easily annoyed or irritable 0 0 0 0  Afraid - awful might happen 0 0 0 0  Total GAD 7 Score 0 0 0 0  Anxiety Difficulty   Not difficult at all Not difficult at all      07/21/2022    9:01 AM 10/28/2022    8:28 AM 01/27/2023    8:51 AM 01/15/2024   11:25 AM 02/03/2024    8:51 AM  Fall Risk  Falls in the past year? 0 0 0 0 0  Was there an injury with Fall? 0  0  0  0  0  Fall  Risk Category Calculator 0 0 0 0 0  Patient at Risk for Falls Due to No Fall Risks  No Fall Risks  No Fall Risks  Fall risk Follow up Falls evaluation completed Falls evaluation completed Falls evaluation completed  Falls evaluation completed     Data saved with a previous flowsheet row definition    Functional Status Survey: Is the patient deaf or have difficulty hearing?: No Does the patient have difficulty seeing, even when wearing glasses/contacts?: No Does the patient have difficulty concentrating, remembering, or making decisions?: No Does the patient have difficulty walking or climbing stairs?: No Does the patient have difficulty dressing or bathing?: No Does the patient have difficulty doing errands alone such as visiting a doctor's office or shopping?: No   Past Medical History:  Past Medical History:  Diagnosis Date   Allergy    Boils    Elevated liver enzymes    GERD (gastroesophageal reflux disease)    Humerus fracture    right   Hypertension    Sleep apnea     Surgical History:  Past Surgical History:  Procedure Laterality Date   APPENDECTOMY     DENTAL SURGERY  01/12/2020   OVARIAN CYST REMOVAL     PAROTIDECTOMY     TOTAL ABDOMINAL HYSTERECTOMY     Medications:  Current Outpatient Medications on File Prior to Visit  Medication Sig   albuterol  (VENTOLIN  HFA) 108 (90 Base) MCG/ACT inhaler Inhale 2 puffs into the lungs every 6 (six) hours as needed for wheezing or shortness of breath.   aspirin 81 MG tablet Take 81 mg by mouth daily.   Azelastine HCl 137 MCG/SPRAY SOLN SMARTSIG:1-2 Spray(s) Both Nares Twice Daily   clindamycin (CLEOCIN T) 1 % lotion Apply topically.   clobetasol cream (TEMOVATE) 0.05 % as needed.   loratadine (CLARITIN) 10 MG tablet Take 10 mg by mouth daily.   minocycline (MINOCIN,DYNACIN) 100 MG capsule TAKE ONE CAPSULE BY MOUTH TWICE A DAY FOR FLARES   Multiple Vitamin (MULTIVITAMIN) tablet Take 1 tablet by mouth daily.   omeprazole  (PRILOSEC) 20 MG capsule Take 20 mg by mouth daily.   No current facility-administered medications on file prior to visit.    Allergies:  Allergies  Allergen Reactions   Penicillin G Benzathine    Sulfa Antibiotics  Social History:  Social History   Socioeconomic History   Marital status: Married    Spouse name: Not on file   Number of children: Not on file   Years of education: Not on file   Highest education level: Associate degree: academic program  Occupational History   Not on file  Tobacco Use   Smoking status: Never   Smokeless tobacco: Never  Vaping Use   Vaping status: Never Used  Substance and Sexual Activity   Alcohol use: Yes    Alcohol/week: 2.0 standard drinks of alcohol   Drug use: Never   Sexual activity: Not Currently    Birth control/protection: None  Other Topics Concern   Not on file  Social History Narrative   Not on file   Social Drivers of Health   Financial Resource Strain: Low Risk  (01/15/2024)   Overall Financial Resource Strain (CARDIA)    Difficulty of Paying Living Expenses: Not hard at all  Food Insecurity: No Food Insecurity (01/15/2024)   Hunger Vital Sign    Worried About Running Out of Food in the Last Year: Never true    Ran Out of Food in the Last Year: Never true  Transportation Needs: No Transportation Needs (01/15/2024)   PRAPARE - Administrator, Civil Service (Medical): No    Lack of Transportation (Non-Medical): No  Physical Activity: Sufficiently Active (01/15/2024)   Exercise Vital Sign    Days of Exercise per Week: 3 days    Minutes of Exercise per Session: 60 min  Stress: No Stress Concern Present (01/15/2024)   [employer] of Occupational Health - Occupational Stress Questionnaire    Feeling of Stress: Not at all  Social Connections: Unknown (01/15/2024)   Social Connection and Isolation Panel    Frequency of Communication with Friends and Family: More than three times a week    Frequency  of Social Gatherings with Friends and Family: More than three times a week    Attends Religious Services: Patient declined    Database Administrator or Organizations: Not on file    Attends Banker Meetings: Not on file    Marital Status: Married  Intimate Partner Violence: Not At Risk (02/15/2023)   Received from [facility]   HITS    Over the last 12 months how often did your partner physically hurt you?: Never    Over the last 12 months how often did your partner insult you or talk down to you?: Rarely    Over the last 12 months how often did your partner threaten you with physical harm?: Never    Over the last 12 months how often did your partner scream or curse at you?: Never   Social History   Tobacco Use  Smoking Status Never  Smokeless Tobacco Never   Social History   Substance and Sexual Activity  Alcohol Use Yes   Alcohol/week: 2.0 standard drinks of alcohol    Family History:  Family History  Adopted: Yes  Family history unknown: Yes    Past medical history, surgical history, medications, allergies, family history and social history reviewed with patient today and changes made to appropriate areas of the chart.   Review of Systems - negative All other ROS negative except what is listed above and in the HPI.      Objective:    BP 134/84 (BP Location: Left Arm, Patient Position: Sitting, Cuff Size: Large)   Pulse 80   Temp 97.9 F (36.6  C) (Oral)   Resp 17   Ht 5' 4.49 (1.638 m)   Wt 185 lb 3.2 oz (84 kg)   LMP  (LMP Unknown)   SpO2 99%   BMI 31.31 kg/m   Wt Readings from Last 3 Encounters:  02/03/24 185 lb 3.2 oz (84 kg)  01/15/24 185 lb 6.4 oz (84.1 kg)  07/29/23 191 lb (86.6 kg)    Physical Exam Vitals and nursing note reviewed.  Constitutional:      General: She is awake. She is not in acute distress.    Appearance: She is well-developed and well-groomed. She is obese. She is not ill-appearing or toxic-appearing.  HENT:      Head: Normocephalic and atraumatic.     Right Ear: Hearing, tympanic membrane, ear canal and external ear normal. No drainage.     Left Ear: Hearing, tympanic membrane, ear canal and external ear normal. No drainage.     Nose: Nose normal.     Right Sinus: No maxillary sinus tenderness or frontal sinus tenderness.     Left Sinus: No maxillary sinus tenderness or frontal sinus tenderness.     Mouth/Throat:     Mouth: Mucous membranes are moist.     Pharynx: Oropharynx is clear. Uvula midline. No pharyngeal swelling, oropharyngeal exudate or posterior oropharyngeal erythema.  Eyes:     General: Lids are normal.        Right eye: No discharge.        Left eye: No discharge.     Extraocular Movements: Extraocular movements intact.     Conjunctiva/sclera: Conjunctivae normal.     Pupils: Pupils are equal, round, and reactive to light.     Visual Fields: Right eye visual fields normal and left eye visual fields normal.  Neck:     Thyroid : No thyromegaly.     Vascular: No carotid bruit.     Trachea: Trachea normal.  Cardiovascular:     Rate and Rhythm: Normal rate and regular rhythm.     Heart sounds: Normal heart sounds. No murmur heard.    No gallop.  Pulmonary:     Effort: Pulmonary effort is normal. No accessory muscle usage or respiratory distress.     Breath sounds: Normal breath sounds.  Chest:  Breasts:    Right: Normal.     Left: Normal.  Abdominal:     General: Bowel sounds are normal.     Palpations: Abdomen is soft. There is no hepatomegaly or splenomegaly.     Tenderness: There is no abdominal tenderness.  Musculoskeletal:        General: Normal range of motion.     Cervical back: Normal range of motion and neck supple.     Right lower leg: No edema.     Left lower leg: No edema.  Lymphadenopathy:     Head:     Right side of head: No submental, submandibular, tonsillar, preauricular or posterior auricular adenopathy.     Left side of head: No submental,  submandibular, tonsillar, preauricular or posterior auricular adenopathy.     Cervical: No cervical adenopathy.     Upper Body:     Right upper body: No supraclavicular, axillary or pectoral adenopathy.     Left upper body: No supraclavicular, axillary or pectoral adenopathy.  Skin:    General: Skin is warm and dry.     Capillary Refill: Capillary refill takes less than 2 seconds.     Findings: No rash.  Neurological:     Mental  Status: She is alert and oriented to person, place, and time.     Gait: Gait is intact.     Deep Tendon Reflexes: Reflexes are normal and symmetric.     Reflex Scores:      Brachioradialis reflexes are 2+ on the right side and 2+ on the left side.      Patellar reflexes are 2+ on the right side and 2+ on the left side. Psychiatric:        Attention and Perception: Attention normal.        Mood and Affect: Mood normal.        Speech: Speech normal.        Behavior: Behavior normal. Behavior is cooperative.        Thought Content: Thought content normal.        Judgment: Judgment normal.    Results for orders placed or performed in visit on 07/29/23  Bayer DCA Hb A1c Waived   Collection Time: 07/29/23  8:35 AM  Result Value Ref Range   HB A1C (BAYER DCA - WAIVED) 5.6 4.8 - 5.6 %  Microalbumin, Urine Waived   Collection Time: 07/29/23  8:35 AM  Result Value Ref Range   Microalb, Ur Waived 10 0 - 19 mg/L   Creatinine, Urine Waived 10 10 - 300 mg/dL   Microalb/Creat Ratio <30 <30 mg/g  Comprehensive metabolic panel with GFR   Collection Time: 07/29/23  8:35 AM  Result Value Ref Range   Glucose 97 70 - 99 mg/dL   BUN 7 (L) 8 - 27 mg/dL   Creatinine, Ser 9.32 0.57 - 1.00 mg/dL   eGFR 99 >40 fO/fpw/8.26   BUN/Creatinine Ratio 10 (L) 12 - 28   Sodium 139 134 - 144 mmol/L   Potassium 4.0 3.5 - 5.2 mmol/L   Chloride 99 96 - 106 mmol/L   CO2 22 20 - 29 mmol/L   Calcium 9.6 8.7 - 10.3 mg/dL   Total Protein 6.9 6.0 - 8.5 g/dL   Albumin 4.6 3.9 - 4.9 g/dL    Globulin, Total 2.3 1.5 - 4.5 g/dL   Bilirubin Total 0.3 0.0 - 1.2 mg/dL   Alkaline Phosphatase 110 44 - 121 IU/L   AST 41 (H) 0 - 40 IU/L   ALT 45 (H) 0 - 32 IU/L  Lipid Panel w/o Chol/HDL Ratio   Collection Time: 07/29/23  8:35 AM  Result Value Ref Range   Cholesterol, Total 153 100 - 199 mg/dL   Triglycerides 894 0 - 149 mg/dL   HDL 43 >60 mg/dL   VLDL Cholesterol Cal 19 5 - 40 mg/dL   LDL Chol Calc (NIH) 91 0 - 99 mg/dL      Assessment & Plan:   Problem List Items Addressed This Visit       Cardiovascular and Mediastinum   Hypertension - Primary   Chronic, stable.  BP at goal in office and at home.  Recommend she monitor BP at least a few mornings a week at home and document.  DASH diet at home.  Continue current medication regimen and adjust as needed.  Labs today: CM, CBC, TSH, UA (per patient request).  Urine ALB 11 Jul 2023.  Return in 6 months.         Relevant Medications   benazepril  (LOTENSIN ) 20 MG tablet   simvastatin  (ZOCOR ) 40 MG tablet   Other Relevant Orders   CBC with Differential/Platelet   Comprehensive metabolic panel with GFR   TSH  Urinalysis, Routine w reflex microscopic     Respiratory   OSA on CPAP   Chronic, stable.  Followed by neurology, continue 100% use of CPAP.        Digestive   GERD (gastroesophageal reflux disease)   Chronic, stable.  Continue current medication regimen and adjust as needed.  Mag level annually.  Risks of PPI use were discussed with patient including bone loss, C. Diff diarrhea, pneumonia, infections, CKD, electrolyte abnormalities.  Verbalizes understanding and chooses to continue the medication.       Relevant Orders   Magnesium     Musculoskeletal and Integument   Werner's syndrome   History of surgery -- parotidectomy right side.  Follows yearly with dermatology and they monitor.  Has some scar tissue remaining to area.        Other   Vitamin D  deficiency   Ongoing, continue supplement and adjust  dosing as needed.  Vit D level today.      Relevant Orders   VITAMIN D  25 Hydroxy (Vit-D Deficiency, Fractures)   Prediabetes   Ongoing with A1c 5.6% last check - recheck today.  Continue diet/exercise focus and recheck A1c annually if remains stable.  Has been focused on lowering sugar which is offering benefit.  Praised for this.      Relevant Orders   Bayer DCA Hb A1c Waived   Obesity   BMI 31.31.  Recommended eating smaller high protein, low fat meals more frequently and exercising 30 mins a day 5 times a week with a goal of 10-15lb weight loss in the next 3 months. Patient voiced their understanding and motivation to adhere to these recommendations.       Hyperlipidemia   Chronic, ongoing.  Continue current medication regimen.  Lipid panel today and adjust statin as needed.      Relevant Medications   benazepril  (LOTENSIN ) 20 MG tablet   simvastatin  (ZOCOR ) 40 MG tablet   Other Relevant Orders   Comprehensive metabolic panel with GFR   Lipid Panel w/o Chol/HDL Ratio   Other Visit Diagnoses       Colon cancer screening       Referral to GI   Relevant Orders   Ambulatory referral to Gastroenterology     Encounter for annual physical exam       Annual physical today with labs and health maintenance reviewed, discussed with patient.        Follow up plan: Return in about 6 months (around 08/03/2024) for HTN/HLD, IFG.   LABORATORY TESTING:  - Pap smear: not applicable -- hysterectomy in 2000  IMMUNIZATIONS:   - Tdap: Tetanus vaccination status reviewed: last tetanus booster within 10 years. - Influenza: Refused -- does not get these annually - Pneumovax: Not applicable - Prevnar: Not applicable - HPV: Not applicable - Zostavax vaccine: Refuses - Covid: has had 2 vaccinations  SCREENING: -Mammogram: Up To Date -- last on 11/26/23 - Colonoscopy: Ordered today - Bone Density: Not applicable obtain at age 64 -Hearing Test: Not applicable  -Spirometry: Not  applicable   PATIENT COUNSELING:   Advised to take 1 mg of folate supplement per day if capable of pregnancy.   Sexuality: Discussed sexually transmitted diseases, partner selection, use of condoms, avoidance of unintended pregnancy  and contraceptive alternatives.   Advised to avoid cigarette smoking.  I discussed with the patient that most people either abstain from alcohol or drink within safe limits (<=14/week and <=4 drinks/occasion for males, <=7/weeks and <= 3 drinks/occasion for  females) and that the risk for alcohol disorders and other health effects rises proportionally with the number of drinks per week and how often a drinker exceeds daily limits.  Discussed cessation/primary prevention of drug use and availability of treatment for abuse.   Diet: Encouraged to adjust caloric intake to maintain  or achieve ideal body weight, to reduce intake of dietary saturated fat and total fat, to limit sodium intake by avoiding high sodium foods and not adding table salt, and to maintain adequate dietary potassium and calcium preferably from fresh fruits, vegetables, and low-fat dairy products.    Stressed the importance of regular exercise  Injury prevention: Discussed safety belts, safety helmets, smoke detector, smoking near bedding or upholstery.   Dental health: Discussed importance of regular tooth brushing, flossing, and dental visits.    NEXT PREVENTATIVE PHYSICAL DUE IN 1 YEAR. Return in about 6 months (around 08/03/2024) for HTN/HLD, IFG.

## 2024-02-03 NOTE — Assessment & Plan Note (Signed)
History of surgery -- parotidectomy right side.  Follows yearly with dermatology and they monitor.  Has some scar tissue remaining to area. 

## 2024-02-04 LAB — COMPREHENSIVE METABOLIC PANEL WITH GFR
ALT: 35 IU/L — ABNORMAL HIGH (ref 0–32)
AST: 32 IU/L (ref 0–40)
Albumin: 4.4 g/dL (ref 3.9–4.9)
Alkaline Phosphatase: 105 IU/L (ref 49–135)
BUN/Creatinine Ratio: 17 (ref 12–28)
BUN: 11 mg/dL (ref 8–27)
Bilirubin Total: 0.3 mg/dL (ref 0.0–1.2)
CO2: 25 mmol/L (ref 20–29)
Calcium: 10.1 mg/dL (ref 8.7–10.3)
Chloride: 100 mmol/L (ref 96–106)
Creatinine, Ser: 0.65 mg/dL (ref 0.57–1.00)
Globulin, Total: 2.4 g/dL (ref 1.5–4.5)
Glucose: 90 mg/dL (ref 70–99)
Potassium: 4.5 mmol/L (ref 3.5–5.2)
Sodium: 141 mmol/L (ref 134–144)
Total Protein: 6.8 g/dL (ref 6.0–8.5)
eGFR: 99 mL/min/1.73 (ref >59)

## 2024-02-04 LAB — LIPID PANEL W/O CHOL/HDL RATIO
Cholesterol, Total: 174 mg/dL (ref 100–199)
HDL: 48 mg/dL (ref >39)
LDL Chol Calc (NIH): 96 mg/dL (ref 0–99)
Triglycerides: 173 mg/dL — ABNORMAL HIGH (ref 0–149)
VLDL Cholesterol Cal: 30 mg/dL (ref 5–40)

## 2024-02-04 LAB — CBC WITH DIFFERENTIAL/PLATELET
Basophils Absolute: 0.1 x10E3/uL (ref 0.0–0.2)
Basos: 1 %
EOS (ABSOLUTE): 0.4 x10E3/uL (ref 0.0–0.4)
Eos: 5 %
Hematocrit: 42.8 % (ref 34.0–46.6)
Hemoglobin: 14 g/dL (ref 11.1–15.9)
Immature Grans (Abs): 0 x10E3/uL (ref 0.0–0.1)
Immature Granulocytes: 0 %
Lymphocytes Absolute: 3.4 x10E3/uL — ABNORMAL HIGH (ref 0.7–3.1)
Lymphs: 35 %
MCH: 30.6 pg (ref 26.6–33.0)
MCHC: 32.7 g/dL (ref 31.5–35.7)
MCV: 93 fL (ref 79–97)
Monocytes Absolute: 0.9 x10E3/uL (ref 0.1–0.9)
Monocytes: 9 %
Neutrophils Absolute: 4.9 x10E3/uL (ref 1.4–7.0)
Neutrophils: 50 %
Platelets: 367 x10E3/uL (ref 150–450)
RBC: 4.58 x10E6/uL (ref 3.77–5.28)
RDW: 12.2 % (ref 11.7–15.4)
WBC: 9.7 x10E3/uL (ref 3.4–10.8)

## 2024-02-04 LAB — TSH: TSH: 1.19 u[IU]/mL (ref 0.450–4.500)

## 2024-02-04 LAB — MAGNESIUM: Magnesium: 2.3 mg/dL (ref 1.6–2.3)

## 2024-02-04 LAB — VITAMIN D 25 HYDROXY (VIT D DEFICIENCY, FRACTURES): Vit D, 25-Hydroxy: 44.8 ng/mL (ref 30.0–100.0)

## 2024-02-05 ENCOUNTER — Ambulatory Visit: Payer: Self-pay | Admitting: Nurse Practitioner

## 2024-02-05 NOTE — Progress Notes (Signed)
 Contacted via MyChart  Good day Carrie Arroyo, your labs have returned: - CBC remains overall stable. - Kidney function, creatinine and eGFR, remains normal. Liver function, AST and ALT, shows mild elevation in ALT but levels in general have trended down and we can continue to monitor. - Remainder of labs overall stable. Any questions? Keep being amazing!!  Thank you for allowing me to participate in your care.  I appreciate you. Kindest regards, Ranessa Kosta

## 2024-02-15 ENCOUNTER — Other Ambulatory Visit: Payer: Self-pay

## 2024-02-15 ENCOUNTER — Telehealth: Payer: Self-pay

## 2024-02-15 DIAGNOSIS — Z1211 Encounter for screening for malignant neoplasm of colon: Secondary | ICD-10-CM

## 2024-02-15 MED ORDER — NA SULFATE-K SULFATE-MG SULF 17.5-3.13-1.6 GM/177ML PO SOLN: 1.0000 | Freq: Once | ORAL | 0 refills | Status: AC

## 2024-02-15 NOTE — Telephone Encounter (Signed)
 Gastroenterology Pre-Procedure Review  Request Date: 05/24/24 Requesting Physician: Dr. Melany  PATIENT REVIEW QUESTIONS: The patient responded to the following health history questions as indicated:    1. Are you having any GI issues? no 2. Do you have a personal history of Polyps? no 3. Do you have a family history of Colon Cancer or Polyps? no 4. Diabetes Mellitus? no 5. Joint replacements in the past 12 months?no 6. Major health problems in the past 3 months?no 7. Any artificial heart valves, MVP, or defibrillator?no    MEDICATIONS & ALLERGIES:    Patient reports the following regarding taking any anticoagulation/antiplatelet therapy:   Plavix, Coumadin, Eliquis, Xarelto, Lovenox, Pradaxa, Brilinta, or Effient? no Aspirin? yes (81 mg daily)  Patient confirms/reports the following medications:  Current Outpatient Medications  Medication Sig Dispense Refill   albuterol  (VENTOLIN  HFA) 108 (90 Base) MCG/ACT inhaler Inhale 2 puffs into the lungs every 6 (six) hours as needed for wheezing or shortness of breath. 6.7 each 1   aspirin 81 MG tablet Take 81 mg by mouth daily.     Azelastine HCl 137 MCG/SPRAY SOLN SMARTSIG:1-2 Spray(s) Both Nares Twice Daily     benazepril  (LOTENSIN ) 20 MG tablet Take 1 tablet (20 mg total) by mouth daily. 90 tablet 4   clindamycin (CLEOCIN T) 1 % lotion Apply topically.     clobetasol cream (TEMOVATE) 0.05 % as needed.  0   loratadine (CLARITIN) 10 MG tablet Take 10 mg by mouth daily.     minocycline (MINOCIN,DYNACIN) 100 MG capsule TAKE ONE CAPSULE BY MOUTH TWICE A DAY FOR FLARES  4   Multiple Vitamin (MULTIVITAMIN) tablet Take 1 tablet by mouth daily.     omeprazole (PRILOSEC) 20 MG capsule Take 20 mg by mouth daily.     simvastatin  (ZOCOR ) 40 MG tablet TAKE 1 TABLET BY MOUTH EVERYDAY AT BEDTIME 90 tablet 4   No current facility-administered medications for this visit.    Patient confirms/reports the following allergies:  Allergies[1]  No orders  of the defined types were placed in this encounter.   AUTHORIZATION INFORMATION Primary Insurance: 1D#: Group #:  Secondary Insurance: 1D#: Group #:  SCHEDULE INFORMATION: Date: 05/24/24 Time: Location: [facility]    [1]  Allergies Allergen Reactions   Penicillin G Benzathine    Sulfa Antibiotics

## 2024-02-18 DIAGNOSIS — Z1330 Encounter for screening examination for mental health and behavioral disorders, unspecified: Secondary | ICD-10-CM | POA: Diagnosis not present

## 2024-02-18 DIAGNOSIS — G4733 Obstructive sleep apnea (adult) (pediatric): Secondary | ICD-10-CM | POA: Diagnosis not present

## 2024-05-24 ENCOUNTER — Ambulatory Visit: Admit: 2024-05-24 | Admitting: Gastroenterology

## 2024-05-24 SURGERY — COLONOSCOPY
Anesthesia: General

## 2024-08-04 ENCOUNTER — Ambulatory Visit: Admitting: Nurse Practitioner
